# Patient Record
Sex: Female | Born: 1960 | Race: Black or African American | Hispanic: No | Marital: Single | Smoking: Never smoker
Health system: Southern US, Community
[De-identification: ages and names within clinical notes are randomized; demographics above are authoritative.]

## PROBLEM LIST (undated history)

## (undated) DIAGNOSIS — D649 Anemia, unspecified: Secondary | ICD-10-CM

## (undated) DIAGNOSIS — I10 Essential (primary) hypertension: Secondary | ICD-10-CM

## (undated) DIAGNOSIS — J40 Bronchitis, not specified as acute or chronic: Secondary | ICD-10-CM

## (undated) DIAGNOSIS — R42 Dizziness and giddiness: Secondary | ICD-10-CM

## (undated) DIAGNOSIS — E109 Type 1 diabetes mellitus without complications: Secondary | ICD-10-CM

## (undated) DIAGNOSIS — T7840XA Allergy, unspecified, initial encounter: Secondary | ICD-10-CM

## (undated) HISTORY — DX: Essential (primary) hypertension: I10

## (undated) HISTORY — DX: Anemia, unspecified: D64.9

## (undated) HISTORY — DX: Bronchitis, not specified as acute or chronic: J40

## (undated) HISTORY — DX: Allergy, unspecified, initial encounter: T78.40XA

## (undated) HISTORY — DX: Type 1 diabetes mellitus without complications: E10.9

## (undated) HISTORY — PX: OTHER SURGICAL HISTORY: SHX169

---

## 1999-02-21 ENCOUNTER — Other Ambulatory Visit: Admission: RE | Admit: 1999-02-21 | Discharge: 1999-02-21 | Payer: Self-pay | Admitting: Endocrinology

## 1999-08-14 ENCOUNTER — Encounter: Admission: RE | Admit: 1999-08-14 | Discharge: 1999-11-12 | Payer: Self-pay | Admitting: Endocrinology

## 2000-12-23 ENCOUNTER — Other Ambulatory Visit: Admission: RE | Admit: 2000-12-23 | Discharge: 2000-12-23 | Payer: Self-pay | Admitting: Obstetrics and Gynecology

## 2006-11-05 ENCOUNTER — Ambulatory Visit: Payer: Self-pay | Admitting: Endocrinology

## 2006-11-05 LAB — CONVERTED CEMR LAB
ALT: 23 units/L (ref 0–40)
AST: 22 units/L (ref 0–37)
Basophils Relative: 0.6 % (ref 0.0–1.0)
Bilirubin, Direct: 0.1 mg/dL (ref 0.0–0.3)
CO2: 27 meq/L (ref 19–32)
Calcium: 9.1 mg/dL (ref 8.4–10.5)
Chloride: 105 meq/L (ref 96–112)
Crystals: NEGATIVE
Eosinophils Relative: 4.1 % (ref 0.0–5.0)
Glucose, Bld: 112 mg/dL — ABNORMAL HIGH (ref 70–99)
Lymphocytes Relative: 35.3 % (ref 12.0–46.0)
Mucus, UA: NEGATIVE
Nitrite: NEGATIVE
Platelets: 255 10*3/uL (ref 150–400)
RBC: 4.78 M/uL (ref 3.87–5.11)
Specific Gravity, Urine: 1.02 (ref 1.000–1.03)
Total CHOL/HDL Ratio: 3.3
Total Protein, Urine: NEGATIVE mg/dL
Total Protein: 7.8 g/dL (ref 6.0–8.3)
Urine Glucose: NEGATIVE mg/dL
WBC: 8.5 10*3/uL (ref 4.5–10.5)

## 2006-11-07 ENCOUNTER — Ambulatory Visit: Payer: Self-pay | Admitting: Endocrinology

## 2006-11-07 LAB — CONVERTED CEMR LAB
Basophils Absolute: 0 10*3/uL (ref 0.0–0.1)
Eosinophils Absolute: 0.3 10*3/uL (ref 0.0–0.6)
Hemoglobin: 10.2 g/dL — ABNORMAL LOW (ref 12.0–15.0)
Hgb A1c MFr Bld: 6.8 % — ABNORMAL HIGH (ref 4.6–6.0)
Iron: 39 ug/dL — ABNORMAL LOW (ref 42–145)
MCHC: 32.2 g/dL (ref 30.0–36.0)
Monocytes Absolute: 0.5 10*3/uL (ref 0.2–0.7)
Monocytes Relative: 6.2 % (ref 3.0–11.0)
RBC: 4.23 M/uL (ref 3.87–5.11)
RDW: 14.5 % (ref 11.5–14.6)

## 2006-12-11 ENCOUNTER — Ambulatory Visit: Payer: Self-pay | Admitting: Endocrinology

## 2007-01-14 ENCOUNTER — Encounter: Admission: RE | Admit: 2007-01-14 | Discharge: 2007-01-14 | Payer: Self-pay | Admitting: Endocrinology

## 2007-01-17 ENCOUNTER — Encounter: Admission: RE | Admit: 2007-01-17 | Discharge: 2007-01-17 | Payer: Self-pay | Admitting: Endocrinology

## 2007-03-20 ENCOUNTER — Encounter: Payer: Self-pay | Admitting: *Deleted

## 2007-03-20 DIAGNOSIS — I10 Essential (primary) hypertension: Secondary | ICD-10-CM | POA: Insufficient documentation

## 2007-03-20 DIAGNOSIS — E118 Type 2 diabetes mellitus with unspecified complications: Secondary | ICD-10-CM | POA: Insufficient documentation

## 2007-03-20 DIAGNOSIS — M2550 Pain in unspecified joint: Secondary | ICD-10-CM | POA: Insufficient documentation

## 2007-03-20 DIAGNOSIS — L259 Unspecified contact dermatitis, unspecified cause: Secondary | ICD-10-CM | POA: Insufficient documentation

## 2007-03-20 DIAGNOSIS — E119 Type 2 diabetes mellitus without complications: Secondary | ICD-10-CM

## 2007-03-20 DIAGNOSIS — J309 Allergic rhinitis, unspecified: Secondary | ICD-10-CM | POA: Insufficient documentation

## 2007-09-10 ENCOUNTER — Encounter: Admission: RE | Admit: 2007-09-10 | Discharge: 2007-09-10 | Payer: Self-pay | Admitting: Endocrinology

## 2010-02-24 ENCOUNTER — Ambulatory Visit: Payer: Self-pay | Admitting: Endocrinology

## 2010-02-24 LAB — CONVERTED CEMR LAB
BUN: 7 mg/dL (ref 6–23)
Basophils Absolute: 0 10*3/uL (ref 0.0–0.1)
Bilirubin, Direct: 0.1 mg/dL (ref 0.0–0.3)
Chloride: 103 meq/L (ref 96–112)
Cholesterol: 127 mg/dL (ref 0–200)
Creatinine, Ser: 0.6 mg/dL (ref 0.4–1.2)
Eosinophils Absolute: 0.4 10*3/uL (ref 0.0–0.7)
Eosinophils Relative: 5.2 % — ABNORMAL HIGH (ref 0.0–5.0)
Glucose, Bld: 120 mg/dL — ABNORMAL HIGH (ref 70–99)
LDL Cholesterol: 70 mg/dL (ref 0–99)
Lymphs Abs: 2.8 10*3/uL (ref 0.7–4.0)
MCHC: 31.6 g/dL (ref 30.0–36.0)
MCV: 73.8 fL — ABNORMAL LOW (ref 78.0–100.0)
Monocytes Absolute: 0.5 10*3/uL (ref 0.1–1.0)
Neutrophils Relative %: 52.7 % (ref 43.0–77.0)
Platelets: 235 10*3/uL (ref 150.0–400.0)
RDW: 18.1 % — ABNORMAL HIGH (ref 11.5–14.6)
TSH: 0.99 microintl units/mL (ref 0.35–5.50)
Total Bilirubin: 0.5 mg/dL (ref 0.3–1.2)
Total Protein, Urine: NEGATIVE mg/dL
Triglycerides: 49 mg/dL (ref 0.0–149.0)
Urine Glucose: NEGATIVE mg/dL
Urobilinogen, UA: 0.2 (ref 0.0–1.0)
VLDL: 9.8 mg/dL (ref 0.0–40.0)
WBC: 8.1 10*3/uL (ref 4.5–10.5)

## 2010-02-28 ENCOUNTER — Ambulatory Visit: Payer: Self-pay | Admitting: Endocrinology

## 2010-02-28 ENCOUNTER — Encounter: Admission: RE | Admit: 2010-02-28 | Discharge: 2010-02-28 | Payer: Self-pay | Admitting: Endocrinology

## 2010-02-28 ENCOUNTER — Encounter: Payer: Self-pay | Admitting: Endocrinology

## 2010-02-28 DIAGNOSIS — M79609 Pain in unspecified limb: Secondary | ICD-10-CM | POA: Insufficient documentation

## 2010-02-28 DIAGNOSIS — D649 Anemia, unspecified: Secondary | ICD-10-CM | POA: Insufficient documentation

## 2010-07-23 LAB — CONVERTED CEMR LAB
Saturation Ratios: 5 % — ABNORMAL LOW (ref 20.0–50.0)
Transferrin: 416.8 mg/dL — ABNORMAL HIGH (ref 212.0–360.0)

## 2010-07-27 NOTE — Assessment & Plan Note (Signed)
Summary: PHYSICAL--STC   Vital Signs:  Patient profile:   50 year old female Height:      63 inches (160.02 cm) Weight:      253.50 pounds (115.23 kg) BMI:     45.07 O2 Sat:      99 % on Room air Temp:     97.6 degrees F (36.44 degrees C) oral Pulse rate:   78 / minute BP sitting:   132 / 84  (right arm) Cuff size:   large  Vitals Entered By: Brenton Grills MA (February 28, 2010 1:27 PM)  O2 Flow:  Room air CC: Physical/pt is not taking Cardizem, Allegra, or Glucophage/aj Is Patient Diabetic? Yes Comments pt is due for tetanus, colonoscopy, and Pap   CC:  Physical/pt is not taking Cardizem, Allegra, and or Glucophage/aj.  History of Present Illness: here for regular wellness examination.  He's feeling pretty well in general, and does not smoke.  alcohol is rare.   Current Medications (verified): 1)  Cardizem Cd 120 Mg  Cp24 (Diltiazem Hcl Coated Beads) .... Take 1 By Mouth Qd 2)  Allegra 180 Mg  Tabs (Fexofenadine Hcl) .... Take 1 By Mouth Once Daily Prn 3)  Glucophage 500 Mg  Tabs (Metformin Hcl) .... Take 1 By Mouth Qd 4)  Motrin Ib 200 Mg Tabs (Ibuprofen) .... 3 Tablet By Mouth Two Times A Day  Allergies (verified): No Known Drug Allergies  Family History: Reviewed history and no changes required. mother had uncertain type of cancer--deceased  Social History: Reviewed history and no changes required. 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  denies dub  Physical Exam  General:  morbidly obese.   Head:  head: no deformity eyes: no periorbital swelling, no proptosis external nose and ears are normal mouth: no lesion seen Neck:  Supple without thyroid enlargement or tenderness.  Breasts:  sees gyn  Lungs:  clear to auscultation.  no respiratory distress  Heart:  Regular rate and rhythm without murmurs or gallops noted. Normal S1,S2.   Abdomen:   abdomen is soft, nontender.  no hepatosplenomegaly.   not distended.  no hernia  Rectal:  sees gyn  Genitalia:  sees gyn  Msk:  muscle bulk and strength are grossly normal.  no obvious joint swelling.  gait is normal and steady  Extremities:  no deformity.  no ulcer on the feet.  feet are of normal color and temp.  no edema  Neurologic:  cn 2-12 grossly intact.   readily moves all 4's.   sensation is intact to touch on the feet  Cervical Nodes:  No significant adenopathy.  Psych:  Alert and cooperative; normal mood and affect; normal attention span and concentration.   Additional Exam:  SEPARATE EVALUATION FOLLOWS--EACH PROBLEM HERE IS NEW, NOT RESPONDING TO TREATMENT, OR POSES SIGNIFICANT RISK TO THE PATIENT'S HEALTH: HISTORY OF THE PRESENT ILLNESS: pt tripped and fell 5d ago.  since then she has 5 days of slight pain at the left hand, worst at the middle finger, and assoc swelling pt has anemia x few years.  she has not recently taken fe tabs.   constipation:  pt says due to fe tabs. dm:  she does not take metformin.   PAST MEDICAL HISTORY reviewed and up to date today REVIEW OF SYSTEMS: denies brbpr and hematuria PHYSICAL EXAMINATION: pulses:  dorsalis pedis intact bilat.  no carotid bruit skin:  abrasions are healing at the left upper arm left hand:  slight diffuse swelling, but more so at the middle finger pip joint LAB/XRAY RESULTS: Iron Saturation      [L]  5.0 %         20.0-50.0 Hemoglobin A1C       [H]  7.9 %   x-ray is neg IMPRESSION: fe-deficiency anemia, needs increased rx hand contusion dm, needs increased rx constipation, due to fe tabs PLAN: see instruction page   Impression & Recommendations:  Problem # 1:  ROUTINE GENERAL MEDICAL EXAM@HEALTH  CARE FACL (ICD-V70.0)  Medications Added to Medication List This Visit: 1)  Glucophage 500 Mg Tabs (Metformin hcl) .... Take 1 by mouth once daily 2)  Motrin Ib 200 Mg Tabs (Ibuprofen) .... 3 tablet by mouth two  times a day  Other Orders: T-Hand Left 3 Views (73130TC) Gastroenterology Referral (GI) TLB-IBC Pnl (Iron/FE;Transferrin) (83550-IBC) TLB-B12, Serum-Total ONLY (82607-B12) TLB-A1C / Hgb A1C (Glycohemoglobin) (83036-A1C) Est. Patient Level IV (95621) Est. Patient 40-64 years (30865)  Preventive Care Screening     pneumovax is refused 2011   Patient Instructions: 1)  blood tests are being ordered for you today.  please call [phone] to hear your test results. 2)  you can stay-off the diltiazem for now. 3)  please consider these measures for your health:  minimize alcohol.  do not use tobacco products.  have a colonoscopy at least every 10 years from age 16.  keep firearms safely stored.  always use seat belts.  have working smoke alarms in your home.  see an eye doctor and dentist regularly.  never drive under the influence of alcohol or drugs (including prescription drugs).   4)  Please schedule a follow-up appointment in 3 months. 5)  refer for colonoscopy.  you will be called with a day and time for an appointment 6)  (update: i left message on phone-tree:  go for colonoscopy.  take miralax.  take fe 1 tab two times a day.  resume metformin.  please call with pharmacy name).   Preventive Care Screening     pneumovax is refused 2011

## 2012-07-16 ENCOUNTER — Emergency Department (INDEPENDENT_AMBULATORY_CARE_PROVIDER_SITE_OTHER)
Admission: EM | Admit: 2012-07-16 | Discharge: 2012-07-16 | Disposition: A | Discharge status: Home / Self Care | Payer: Self-pay | Source: Home / Self Care

## 2012-07-16 DIAGNOSIS — I10 Essential (primary) hypertension: Secondary | ICD-10-CM

## 2012-07-16 LAB — POCT I-STAT, CHEM 8
BUN: 10 mg/dL (ref 6–23)
Creatinine, Ser: 0.6 mg/dL (ref 0.50–1.10)
Glucose, Bld: 253 mg/dL — ABNORMAL HIGH (ref 70–99)
Hemoglobin: 16.7 g/dL — ABNORMAL HIGH (ref 12.0–15.0)
Potassium: 4 mEq/L (ref 3.5–5.1)
Sodium: 137 mEq/L (ref 135–145)

## 2012-07-16 MED ORDER — HYDROCHLOROTHIAZIDE 12.5 MG PO TABS: 12.5000 mg | ORAL_TABLET | Freq: Every day | ORAL | Status: DC

## 2012-07-16 NOTE — ED Provider Notes (Signed)
History     CSN: [id number]  Arrival date & time 07/16/12  1549   None     Chief Complaint  Patient presents with  . Hypertension    (Consider location/radiation/quality/duration/timing/severity/associated sxs/prior treatment) HPI  Patient presents for evaluation of elevated BP measurement at a pre-employment visit. Onset was today with unchanged course since that time. On ROS patient denies blurred vision, chest pain, dyspnea, headache, neck aches, orthopnea, palpitations, paroxysmal nocturnal dyspnea, peripheral edema, pulsating in the ears and tiredness/fatigue. States she feels perfectly normal but just had the high reading in the office.  Cardiovascular risk factors are hypertension and obesity (BMI >= 30 kg/m2). Agents associated with hypertension include none. Previously on diltiazem only.   Past medical history-hypertension but lost 70 lbs 2 years ago and no longer on meds. Has not seen PCP since that time. Patient also told at risk for DM based off a1c.   Family history is positive for hypertension and diabetes mellitus in both parents.  Social history-no smoking or illicit drugs. Occasional glass of wine.   OB History    No data available      Review of Systems  Allergies  Review of patient's allergies indicates no known allergies.  Home Medications  No current outpatient prescriptions on file.  BP 205/110  Pulse 85  Temp 98.1 F (36.7 C) (Oral)  Resp 18  Ht 4\' 11"  (1.499 m)  Wt 200 lb (90.719 kg)  BMI 40.39 kg/m2  SpO2 100%  LMP 07/16/2012 Repeat with large cuff noted at 166/85 on right and 194/105 on left.   Physical Exam  Constitutional: She is oriented to person, place, and time. She appears well-developed and well-nourished.  HENT:  Head: Normocephalic and atraumatic.  Eyes: Conjunctivae normal and EOM are normal. Pupils are equal, round, and reactive to light.       Fundi benign on portions visualized.   Neck: Normal range of motion. Neck  supple.  Cardiovascular: Normal rate and regular rhythm.  Exam reveals no gallop and no friction rub.   Murmur (2/6 SEM (reports has had this since birth)) heard. Pulmonary/Chest: Effort normal and breath sounds normal.  Abdominal: Soft. Bowel sounds are normal.  Musculoskeletal: Normal range of motion. She exhibits edema (trace).  Neurological: She is alert and oriented to person, place, and time.  Skin: Skin is warm and dry.    ED Course  Procedures   No diagnosis found.  Studies: Lab: i-stat chem 8 shows elevated CBG and normal creatinine with no electrolyte abnormalities Initial Electrocardiogram: NSR and t wave inversionsin v1-v3, unknown how old these are. Some flattening t waves in III, avf. Otherwise normal. Reviewed by Dr. Tressia Danas who adds the following: no ischemic changes.   Records Reviewed: Previous electrocardiograms not available.   Treatments: None but planned HCTZ on outpatient basis  Disposition: Home stable. Patient to follow up with PCP in 1-2 days.   MDM  Elevated blood pressure with history of hypertension. Will start HCTZ with close PCP follow up. Patient noted to have elevated CBG and will need to follow up with PCP for DM testing.  No signs of end organ damage by history, i-stat with creatinine, or EKG.   Shelva Majestic, MD 07/16/12 1818  Shelva Majestic, MD 07/16/12 [phone]

## 2012-07-16 NOTE — ED Notes (Signed)
Pt reports that she went for occ. Health pre employment physical and that her bloopd pressure was extremely high. Denies any signs or symptoms of htn . Does have hx of htn - recent weight loss caused primary md to stop htn meds

## 2012-07-17 NOTE — ED Provider Notes (Signed)
Medical screening examination/treatment/procedure(s) were performed by PGY-2 FM resident and as supervising physician I was immediately available for consultation/collaboration.   Sharin Grave, MD   Sharin Grave, MD 07/17/12 1011

## 2012-08-01 ENCOUNTER — Ambulatory Visit (INDEPENDENT_AMBULATORY_CARE_PROVIDER_SITE_OTHER): Payer: Managed Care, Other (non HMO) | Admitting: Endocrinology

## 2012-08-01 VITALS — BP 144/78 | HR 100 | Wt 207.0 lb

## 2012-08-01 DIAGNOSIS — I10 Essential (primary) hypertension: Secondary | ICD-10-CM

## 2012-08-01 DIAGNOSIS — E119 Type 2 diabetes mellitus without complications: Secondary | ICD-10-CM

## 2012-08-01 DIAGNOSIS — Z79899 Other long term (current) drug therapy: Secondary | ICD-10-CM

## 2012-08-01 DIAGNOSIS — D649 Anemia, unspecified: Secondary | ICD-10-CM

## 2012-08-01 DIAGNOSIS — H510 Palsy (spasm) of conjugate gaze: Secondary | ICD-10-CM

## 2012-08-01 LAB — BASIC METABOLIC PANEL
CO2: 27 mEq/L (ref 19–32)
Chloride: 94 mEq/L — ABNORMAL LOW (ref 96–112)
Glucose, Bld: 393 mg/dL — ABNORMAL HIGH (ref 70–99)
Sodium: 131 mEq/L — ABNORMAL LOW (ref 135–145)

## 2012-08-01 LAB — CBC WITH DIFFERENTIAL/PLATELET
Basophils Absolute: 0 10*3/uL (ref 0.0–0.1)
Basophils Relative: 0.2 % (ref 0.0–3.0)
Eosinophils Relative: 2.1 % (ref 0.0–5.0)
HCT: 42.7 % (ref 36.0–46.0)
Hemoglobin: 14.1 g/dL (ref 12.0–15.0)
Lymphocytes Relative: 32.1 % (ref 12.0–46.0)
Lymphs Abs: 2.4 10*3/uL (ref 0.7–4.0)
Monocytes Relative: 4.4 % (ref 3.0–12.0)
Neutro Abs: 4.6 10*3/uL (ref 1.4–7.7)
RBC: 5.39 Mil/uL — ABNORMAL HIGH (ref 3.87–5.11)
WBC: 7.5 10*3/uL (ref 4.5–10.5)

## 2012-08-01 LAB — URINALYSIS, ROUTINE W REFLEX MICROSCOPIC
Bilirubin Urine: NEGATIVE
Hgb urine dipstick: NEGATIVE
Ketones, ur: NEGATIVE
Leukocytes, UA: NEGATIVE
Urobilinogen, UA: 0.2 (ref 0.0–1.0)

## 2012-08-01 LAB — HEPATIC FUNCTION PANEL
ALT: 31 U/L (ref 0–35)
Total Bilirubin: 0.4 mg/dL (ref 0.3–1.2)

## 2012-08-01 LAB — IBC PANEL: Saturation Ratios: 17.1 % — ABNORMAL LOW (ref 20.0–50.0)

## 2012-08-01 LAB — LIPID PANEL
HDL: 57.2 mg/dL (ref >39.00)
Total CHOL/HDL Ratio: 3
VLDL: 16.2 mg/dL (ref 0.0–40.0)

## 2012-08-01 LAB — MICROALBUMIN / CREATININE URINE RATIO
Creatinine,U: 29.1 mg/dL
Microalb Creat Ratio: 6.5 mg/g (ref 0.0–30.0)

## 2012-08-01 LAB — HEMOGLOBIN A1C: Hgb A1c MFr Bld: 15.5 % — ABNORMAL HIGH (ref 4.6–6.5)

## 2012-08-01 MED ORDER — METFORMIN HCL ER 500 MG PO TB24: 500.0000 mg | ORAL_TABLET | Freq: Two times a day (BID) | ORAL | Status: DC

## 2012-08-01 MED ORDER — LOSARTAN POTASSIUM-HCTZ 50-12.5 MG PO TABS: 1.0000 | ORAL_TABLET | Freq: Every day | ORAL | Status: DC

## 2012-08-01 NOTE — Progress Notes (Signed)
  Subjective:    Patient ID: Kaylee Lynn, female    DOB: 1960-11-28, 52 y.o.   MRN: [id number]  HPI Pt was seen in ER 2 weeks ago with severe HTN.  She was rx'ed hctz.  Denies chest pain. She was also noted to have hyperglycemia.  She has not recently taken metformin.  She has lost weight. She was also noted to have polycythemia.  Denies sob. No past medical history on file.  No past surgical history on file.  History   Social History  . Marital Status: Single    Spouse Name: N/A    Number of Children: N/A  . Years of Education: N/A   Occupational History  . Not on file.   Social History Main Topics  . Smoking status: Not on file  . Smokeless tobacco: Not on file  . Alcohol Use: Not on file  . Drug Use: Not on file  . Sexually Active: Not on file   Other Topics Concern  . Not on file   Social History Narrative  . No narrative on file    Current Outpatient Prescriptions on File Prior to Visit  Medication Sig Dispense Refill  . losartan-hydrochlorothiazide (HYZAAR) 50-12.5 MG per tablet Take 1 tablet by mouth daily.  30 tablet  11  . metFORMIN (GLUCOPHAGE-XR) 500 MG 24 hr tablet Take 1 tablet (500 mg total) by mouth 2 (two) times daily.  60 tablet  11    No Known Allergies  No family history on file.  BP 144/78  Pulse 100  Wt 207 lb (93.895 kg)  SpO2 96%  LMP 07/16/2012   Review of Systems Denies edema, but she has slight acral numbness.      Objective:   Physical Exam VITAL SIGNS:  See vs page GENERAL: no distress Ext: no edema.      Assessment & Plan:  HTN, improved Polycythemia, uncertain etiology DM, therapy limited by noncompliance.  i'll do the best i can.

## 2012-08-01 NOTE — Patient Instructions (Addendum)
Change your blood pressure pill to losartan-hctz.   Also, please resume the metformin.  i have sent 2 prescriptions to your pharmacy. Please come back for a regular physical appointment in 2 weeks. blood tests are being requested for you today.  We'll contact you with results. Refer to an eye specialist.  you will receive a phone call, about a day and time for an appointment

## 2012-09-01 ENCOUNTER — Telehealth: Payer: Self-pay | Admitting: Endocrinology

## 2012-09-01 NOTE — Telephone Encounter (Signed)
Wants results from most recent labs, also wants to know when next appt is needed. CB# [phone] / Sherri S.

## 2012-09-02 NOTE — Telephone Encounter (Signed)
Left message pt is past due for 2 week follow-up ov

## 2012-09-09 ENCOUNTER — Ambulatory Visit: Payer: Managed Care, Other (non HMO) | Admitting: Endocrinology

## 2013-09-22 ENCOUNTER — Ambulatory Visit (INDEPENDENT_AMBULATORY_CARE_PROVIDER_SITE_OTHER): Payer: Managed Care, Other (non HMO) | Admitting: Endocrinology

## 2013-09-22 VITALS — BP 130/98 | HR 88 | Temp 98.6°F | Ht 59.0 in | Wt 197.0 lb

## 2013-09-22 DIAGNOSIS — Z23 Encounter for immunization: Secondary | ICD-10-CM

## 2013-09-22 DIAGNOSIS — E119 Type 2 diabetes mellitus without complications: Secondary | ICD-10-CM

## 2013-09-22 DIAGNOSIS — D649 Anemia, unspecified: Secondary | ICD-10-CM

## 2013-09-22 DIAGNOSIS — I10 Essential (primary) hypertension: Secondary | ICD-10-CM

## 2013-09-22 DIAGNOSIS — Z129 Encounter for screening for malignant neoplasm, site unspecified: Secondary | ICD-10-CM | POA: Insufficient documentation

## 2013-09-22 DIAGNOSIS — Z79899 Other long term (current) drug therapy: Secondary | ICD-10-CM

## 2013-09-22 LAB — MICROALBUMIN / CREATININE URINE RATIO
CREATININE, U: 45.5 mg/dL
Microalb Creat Ratio: 4.2 mg/g (ref 0.0–30.0)
Microalb, Ur: 1.9 mg/dL (ref 0.0–1.9)

## 2013-09-22 LAB — HEPATIC FUNCTION PANEL
ALBUMIN: 3.7 g/dL (ref 3.5–5.2)
ALT: 29 U/L (ref 0–35)
AST: 31 U/L (ref 0–37)
Alkaline Phosphatase: 70 U/L (ref 39–117)
Bilirubin, Direct: 0.1 mg/dL (ref 0.0–0.3)
TOTAL PROTEIN: 7.4 g/dL (ref 6.0–8.3)
Total Bilirubin: 0.4 mg/dL (ref 0.3–1.2)

## 2013-09-22 LAB — URINALYSIS, ROUTINE W REFLEX MICROSCOPIC
Bilirubin Urine: NEGATIVE
HGB URINE DIPSTICK: NEGATIVE
KETONES UR: NEGATIVE
Leukocytes, UA: NEGATIVE
Nitrite: NEGATIVE
Specific Gravity, Urine: 1.015 (ref 1.000–1.030)
Total Protein, Urine: NEGATIVE
UROBILINOGEN UA: 0.2 (ref 0.0–1.0)
Urine Glucose: >=1000 — AB
pH: 6 (ref 5.0–8.0)

## 2013-09-22 LAB — CBC WITH DIFFERENTIAL/PLATELET
BASOS ABS: 0 10*3/uL (ref 0.0–0.1)
Basophils Relative: 0.2 % (ref 0.0–3.0)
Eosinophils Absolute: 0.2 10*3/uL (ref 0.0–0.7)
Eosinophils Relative: 2.2 % (ref 0.0–5.0)
HEMATOCRIT: 42.3 % (ref 36.0–46.0)
Hemoglobin: 13.4 g/dL (ref 12.0–15.0)
LYMPHS ABS: 2.7 10*3/uL (ref 0.7–4.0)
Lymphocytes Relative: 37.6 % (ref 12.0–46.0)
MCHC: 31.8 g/dL (ref 30.0–36.0)
MCV: 80.7 fl (ref 78.0–100.0)
MONO ABS: 0.4 10*3/uL (ref 0.1–1.0)
Monocytes Relative: 5.7 % (ref 3.0–12.0)
Neutro Abs: 3.8 10*3/uL (ref 1.4–7.7)
Neutrophils Relative %: 54.3 % (ref 43.0–77.0)
PLATELETS: 179 10*3/uL (ref 150.0–400.0)
RBC: 5.25 Mil/uL — ABNORMAL HIGH (ref 3.87–5.11)
RDW: 13.4 % (ref 11.5–14.6)
WBC: 7.1 10*3/uL (ref 4.5–10.5)

## 2013-09-22 LAB — IBC PANEL
Iron: 71 ug/dL (ref 42–145)
Saturation Ratios: 19.8 % — ABNORMAL LOW (ref 20.0–50.0)
Transferrin: 256.6 mg/dL (ref 212.0–360.0)

## 2013-09-22 LAB — BASIC METABOLIC PANEL
BUN: 16 mg/dL (ref 6–23)
CHLORIDE: 103 meq/L (ref 96–112)
CO2: 23 meq/L (ref 19–32)
CREATININE: 0.5 mg/dL (ref 0.4–1.2)
Calcium: 9 mg/dL (ref 8.4–10.5)
GFR: 153.99 mL/min (ref >60.00)
Glucose, Bld: 304 mg/dL — ABNORMAL HIGH (ref 70–99)
Potassium: 4 mEq/L (ref 3.5–5.1)
Sodium: 135 mEq/L (ref 135–145)

## 2013-09-22 LAB — HEMOGLOBIN A1C

## 2013-09-22 LAB — LIPID PANEL
CHOL/HDL RATIO: 3
Cholesterol: 171 mg/dL (ref 0–200)
HDL: 59.3 mg/dL (ref >39.00)
LDL CALC: 96 mg/dL (ref 0–99)
Triglycerides: 79 mg/dL (ref 0.0–149.0)
VLDL: 15.8 mg/dL (ref 0.0–40.0)

## 2013-09-22 LAB — TSH: TSH: 0.55 u[IU]/mL (ref 0.35–5.50)

## 2013-09-22 MED ORDER — TRIAMCINOLONE ACETONIDE 0.1 % EX CREA: 1.0000 "application " | TOPICAL_CREAM | Freq: Three times a day (TID) | CUTANEOUS | Status: DC

## 2013-09-22 MED ORDER — LOSARTAN POTASSIUM-HCTZ 100-25 MG PO TABS: 1.0000 | ORAL_TABLET | Freq: Every day | ORAL | Status: DC

## 2013-09-22 NOTE — Progress Notes (Signed)
Subjective:    Patient ID: Kaylee Lynn, female    DOB: 1960-09-26, 53 y.o.   MRN: [id number]  HPI Pt is here for regular wellness examination, and is feeling pretty well in general, and says chronic med probs are stable, except as noted below No past medical history on file.  No past surgical history on file.  History   Social History  . Marital Status: Single    Spouse Name: N/A    Number of Children: N/A  . Years of Education: N/A   Occupational History  . Not on file.   Social History Main Topics  . Smoking status: Not on file  . Smokeless tobacco: Not on file  . Alcohol Use: Not on file  . Drug Use: Not on file  . Sexual Activity: Not on file   Other Topics Concern  . Not on file   Social History Narrative  . No narrative on file    Current Outpatient Prescriptions on File Prior to Visit  Medication Sig Dispense Refill  . metFORMIN (GLUCOPHAGE-XR) 500 MG 24 hr tablet Take 1 tablet (500 mg total) by mouth 2 (two) times daily.  60 tablet  11   No current facility-administered medications on file prior to visit.    No Known Allergies  No family history on file.  BP 130/98  Pulse 88  Temp(Src) 98.6 F (37 C) (Oral)  Ht 4\' 11"  (1.499 m)  Wt 197 lb (89.359 kg)  BMI 39.77 kg/m2  SpO2 98%  Review of Systems  Constitutional: Negative for fever.  HENT: Negative for hearing loss.   Eyes: Negative for itching.  Cardiovascular: Negative for chest pain.  Gastrointestinal: Negative for blood in stool.  Endocrine: Negative for cold intolerance.  Genitourinary: Negative for hematuria.  Musculoskeletal: Negative for back pain.  Skin: Negative for wound.  Allergic/Immunologic: Positive for environmental allergies.  Neurological: Negative for syncope and numbness.  Hematological: Bruises/bleeds easily.  Psychiatric/Behavioral: Negative for dysphoric mood.       Objective:   Physical Exam VS: see vs page GEN: no distress HEAD: head: no  deformity eyes: no periorbital swelling, no proptosis external nose and ears are normal mouth: no lesion seen NECK: supple, thyroid is not enlarged CHEST WALL: no deformity LUNGS:  Clear to auscultation BREASTS: sees gyn CV: reg rate and rhythm, no murmur ABD: abdomen is soft, nontender.  no hepatosplenomegaly.  not distended.  no hernia GENITALIA/RECTAL: sees gyn MUSCULOSKELETAL: muscle bulk and strength are grossly normal.  no obvious joint swelling.  gait is normal and steady PULSES: no carotid bruit NEURO:  cn 2-12 grossly intact.   readily moves all 4's.   SKIN:  Normal texture and temperature.  No rash or suspicious lesion is visible.   NODES:  None palpable at the neck PSYCH: alert, well-oriented.  Does not appear anxious nor depressed.      Assessment & Plan:  Wellness visit today, with problems stable, except as noted.    SEPARATE EVALUATION FOLLOWS--EACH PROBLEM HERE IS NEW, NOT RESPONDING TO TREATMENT, OR POSES SIGNIFICANT RISK TO THE PATIENT'S HEALTH: HISTORY OF THE PRESENT ILLNESS: Pt states many years of slight rash on the dorsal aspect of the hands, and assoc itching.  HC cream doesn't help.   PAST MEDICAL HISTORY reviewed and up to date today REVIEW OF SYSTEMS: She has lost a few lbs.  Denies sob. PHYSICAL EXAMINATION: VITAL SIGNS:  See vs page GENERAL: no distress LAB/XRAY RESULTS: Lab Results  Component Value Date  HGBA1C 16.0 Repeated and verified X2.* 09/22/2013  IMPRESSION: Eczema, worse DM: with severe hyperglycemia PLAN: See instruction page Pt is called in to start insulin asap.

## 2013-09-22 NOTE — Patient Instructions (Addendum)
please consider these measures for your health:  minimize alcohol.  do not use tobacco products.  have a colonoscopy at least every 10 years from age 55.  Women should have an annual mammogram from age 37.  keep firearms safely stored.  always use seat belts.  have working smoke alarms in your home.  see an eye doctor and dentist regularly.  never drive under the influence of alcohol or drugs (including prescription drugs).  i have sent a prescription to your pharmacy, for a skin cream.   Please increase the blood-pressure pill.  i have sent a prescription to your pharmacy.   blood tests are being requested for you today.  We'll contact you with results.  Please come back for a follow-up appointment in 6 months.

## 2013-09-23 ENCOUNTER — Ambulatory Visit (INDEPENDENT_AMBULATORY_CARE_PROVIDER_SITE_OTHER): Payer: Managed Care, Other (non HMO) | Admitting: Endocrinology

## 2013-09-23 ENCOUNTER — Encounter: Payer: Self-pay | Admitting: Endocrinology

## 2013-09-23 ENCOUNTER — Encounter: Payer: Managed Care, Other (non HMO) | Attending: Endocrinology | Admitting: Nutrition

## 2013-09-23 VITALS — BP 130/96 | HR 78 | Temp 98.3°F | Ht 59.0 in | Wt 195.0 lb

## 2013-09-23 DIAGNOSIS — E119 Type 2 diabetes mellitus without complications: Secondary | ICD-10-CM

## 2013-09-23 DIAGNOSIS — Z713 Dietary counseling and surveillance: Secondary | ICD-10-CM | POA: Insufficient documentation

## 2013-09-23 MED ORDER — ONETOUCH VERIO IQ SYSTEM W/DEVICE KIT: 1.0000 | PACK | Freq: Once | Status: DC

## 2013-09-23 MED ORDER — INSULIN DETEMIR 100 UNIT/ML FLEXPEN: 40.0000 [IU] | PEN_INJECTOR | SUBCUTANEOUS | Status: DC

## 2013-09-23 MED ORDER — GLUCOSE BLOOD VI STRP: 1.0000 | ORAL_STRIP | Freq: Two times a day (BID) | Status: DC

## 2013-09-23 NOTE — Patient Instructions (Addendum)
i have sent a prescription to your pharmacy, to start the insulin. 40 units a day is probably not enough. Call next week if it is still high, so we can increase it.   Please come back for a follow-up appointment in 2 weeks.   check your blood sugar twice a day.  vary the time of day when you check, between before the 3 meals, and at bedtime.  also check if you have symptoms of your blood sugar being too high or too low.  please keep a record of the readings and bring it to your next appointment here.  You can write it on any piece of paper.  please call us sooner if your blood sugar goes below 70, or if you have a lot of readings over 200.

## 2013-09-23 NOTE — Progress Notes (Signed)
This patient was instructed on the use of the Levemir pen.  She was given a starter kit and sample  Flextouch pen with BD pen starter kit..(exp. 4/16).  She drew up 40 units and injected without difficulty into her abdominal area. We discussed the timing of this insulin and the need to take it at the same time each day.   We also discussed low blood sugars--symptoms and treatment.  She was given a BD pen starter kit with glucose tablets to use for this.  She was also told to call if low blood sugars occur.  She agreed to do this.  She was advised to read over information given on the Levemir and diabetes and to call if questions.  We also discussed diet.  She is eating cereal ( she had some for a HS snack last night).   We discussed other snack options and other breakfast options that she can use.    She was given a One Touch Verio meter and shown how to use it.  Date/time set and she re demonstrated the technique well.  Her blood sugar was 386 acL today.  Discussed goals for blood sugar readings.  She was told to bring her meter to each appt.  She agreed to do this.

## 2013-09-23 NOTE — Progress Notes (Signed)
   Subjective:    Patient ID: Kaylee Lynn, female    DOB: September 19, 1960, 53 y.o.   MRN: [id number]  HPI Pt returns for f/u of type 2 DM (dx'ed 2008: she has mild if any neuropathy of the lower extremities; no known associated chronic complications; she has never been on insulin).   Pt reports polyuria No past medical history on file.  No past surgical history on file.  History   Social History  . Marital Status: Single    Spouse Name: N/A    Number of Children: N/A  . Years of Education: N/A   Occupational History  . Not on file.   Social History Main Topics  . Smoking status: Unknown If Ever Smoked  . Smokeless tobacco: Not on file  . Alcohol Use: Not on file  . Drug Use: Not on file  . Sexual Activity: Not on file   Other Topics Concern  . Not on file   Social History Narrative  . No narrative on file    Current Outpatient Prescriptions on File Prior to Visit  Medication Sig Dispense Refill  . losartan-hydrochlorothiazide (HYZAAR) 100-25 MG per tablet Take 1 tablet by mouth daily.  30 tablet  11  . metFORMIN (GLUCOPHAGE-XR) 500 MG 24 hr tablet Take 1 tablet (500 mg total) by mouth 2 (two) times daily.  60 tablet  11  . triamcinolone cream (KENALOG) 0.1 % Apply 1 application topically 3 (three) times daily. As needed for itching or rash  45 g  4   No current facility-administered medications on file prior to visit.    No Known Allergies  No family history on file.  BP 130/96  Pulse 78  Temp(Src) 98.3 F (36.8 C) (Oral)  Ht 4\' 11"  (1.499 m)  Wt 195 lb (88.451 kg)  BMI 39.36 kg/m2  SpO2 96%  Review of Systems Pt has polyuria also.  Denies n/v.   She has lost a few lbs      Objective:   Physical Exam VITAL SIGNS:  See vs page GENERAL: no distress   Lab Results  Component Value Date   HGBA1C 16.0 Repeated and verified X2.* 09/22/2013      Assessment & Plan:  DM: she needs insulin: after discussion, pt chooses qd insulin.   Weight loss: due to  severe hypoglycemia. HTN: as she just started increased rx, we'll continue the same for now.

## 2013-09-23 NOTE — Patient Instructions (Signed)
Stop eating cold cereal and milk, sweet drinks, and fruit juices.   Test blood sugars before breakfast, and at least one other time during the day--before meals or at bedtime. Take 40u of Levemir in the AM at the same time every day.

## 2013-10-08 ENCOUNTER — Ambulatory Visit (INDEPENDENT_AMBULATORY_CARE_PROVIDER_SITE_OTHER): Payer: Managed Care, Other (non HMO) | Admitting: Endocrinology

## 2013-10-08 ENCOUNTER — Encounter: Payer: Self-pay | Admitting: Endocrinology

## 2013-10-08 VITALS — BP 140/89 | HR 71 | Temp 98.1°F | Ht 59.0 in | Wt 197.0 lb

## 2013-10-08 DIAGNOSIS — E119 Type 2 diabetes mellitus without complications: Secondary | ICD-10-CM

## 2013-10-08 NOTE — Progress Notes (Signed)
   Subjective:    Patient ID: Kaylee Lynn, female    DOB: 13-Aug-1960, 53 y.o.   MRN: [id number]  HPI Pt returns for f/u of type 2 DM (dx'ed 2008: she has mild if any neuropathy of the lower extremities; no known associated chronic complications; pt started on insulin in march of 2015; she chose qd insulin; she has never had severe hypoglycemia, or DKA).  Pt reports polyuria.  no cbg record, but states cbg's vary from 70-200's.  It is in general higher in am than in the afternoon.   No past medical history on file.  No past surgical history on file.  History   Social History  . Marital Status: Single    Spouse Name: N/A    Number of Children: N/A  . Years of Education: N/A   Occupational History  . Not on file.   Social History Main Topics  . Smoking status: Unknown If Ever Smoked  . Smokeless tobacco: Not on file  . Alcohol Use: Not on file  . Drug Use: Not on file  . Sexual Activity: Not on file   Other Topics Concern  . Not on file   Social History Narrative  . No narrative on file    Current Outpatient Prescriptions on File Prior to Visit  Medication Sig Dispense Refill  . Blood Glucose Monitoring Suppl (ONETOUCH VERIO IQ SYSTEM) W/DEVICE KIT 1 Device by Does not apply route once.  1 kit  0  . glucose blood (ONETOUCH VERIO) test strip 1 each by Other route 2 (two) times daily. And lancets 2/day 250.01  100 each  12  . Insulin Detemir (LEVEMIR FLEXTOUCH) 100 UNIT/ML Pen Inject 40 Units into the skin every morning. And pen needles 1/day  15 mL  11  . losartan-hydrochlorothiazide (HYZAAR) 100-25 MG per tablet Take 1 tablet by mouth daily.  30 tablet  11  . triamcinolone cream (KENALOG) 0.1 % Apply 1 application topically 3 (three) times daily. As needed for itching or rash  45 g  4   No current facility-administered medications on file prior to visit.    No Known Allergies  No family history on file.  BP 140/89  Pulse 71  Temp(Src) 98.1 F (36.7 C) (Oral)   Ht _0  (1.499 m)  Wt 197 lb (89.359 kg)  BMI 39.77 kg/m2  SpO2 95%  Review of Systems She denies hypoglycemia.  She has gained a few lbs.      Objective:   Physical Exam VITAL SIGNS:  See vs page GENERAL: no distress SKIN:  Insulin injection sites at the anterior abdomen are normal.        Assessment & Plan:  DM: control is improved on levemir.  However, based on the pattern of her cbg's, she appears to need to slower-acting insulin, but she declines for now.   Weight gain: this complicates the rx of DM.

## 2013-10-08 NOTE — Patient Instructions (Addendum)
Please continue the same insulin for now.  Please come back for a follow-up appointment in 2 months.   Because of the pattern of your blood sugars, you may need to change to a slower insulin called "lantus."  check your blood sugar twice a day.  vary the time of day when you check, between before the 3 meals, and at bedtime.  also check if you have symptoms of your blood sugar being too high or too low.  please keep a record of the readings and bring it to your next appointment here.  You can write it on any piece of paper.  please call us sooner if your blood sugar goes below 70, or if you have a lot of readings over 200.

## 2013-10-22 ENCOUNTER — Encounter: Payer: Self-pay | Admitting: Endocrinology

## 2013-12-22 ENCOUNTER — Encounter: Payer: Self-pay | Admitting: Endocrinology

## 2013-12-22 ENCOUNTER — Ambulatory Visit (INDEPENDENT_AMBULATORY_CARE_PROVIDER_SITE_OTHER): Payer: Managed Care, Other (non HMO) | Admitting: Endocrinology

## 2013-12-22 VITALS — BP 132/80 | HR 82 | Temp 98.3°F | Ht 59.0 in | Wt 211.0 lb

## 2013-12-22 DIAGNOSIS — E119 Type 2 diabetes mellitus without complications: Secondary | ICD-10-CM

## 2013-12-22 MED ORDER — GLUCOSE BLOOD VI STRP: 1.0000 | ORAL_STRIP | Freq: Two times a day (BID) | Status: DC

## 2013-12-22 MED ORDER — INSULIN GLARGINE 100 UNIT/ML SOLOSTAR PEN: 40.0000 [IU] | PEN_INJECTOR | SUBCUTANEOUS | Status: DC

## 2013-12-22 NOTE — Patient Instructions (Addendum)
Please change levemir to "lantus," 40 units each morning.   check your blood sugar twice a day.  vary the time of day when you check, between before the 3 meals, and at bedtime.  also check if you have symptoms of your blood sugar being too high or too low.  please keep a record of the readings and bring it to your next appointment here.  You can write it on any piece of paper.  please call us sooner if your blood sugar goes below 70, or if you have a lot of readings over 200.  Please come back for a follow-up appointment in 2 months.  Please see a dietician specialist.  you will receive a phone call, about a day and time for an appointment.

## 2013-12-22 NOTE — Progress Notes (Signed)
Subjective:    Patient ID: Kaylee Lynn, female    DOB: 16-May-1961, 53 y.o.   MRN: [id number]  HPI Pt returns for f/u of type 2 DM (dx'ed 2008: she has mild if any neuropathy of the lower extremities; no known associated chronic complications; pt started on insulin in march of 2015; she chose qd insulin; she has never had pancreatitis, severe hypoglycemia, or DKA).  no cbg record, but states cbg's vary from 150-326.  She says it is highest in the afternoon. She denies hypoglycemia.   Past Medical History  Diagnosis Date  . Type I (juvenile type) diabetes mellitus without mention of complication, not stated as uncontrolled     No past surgical history on file.  History   Social History  . Marital Status: Single    Spouse Name: N/A    Number of Children: N/A  . Years of Education: N/A   Occupational History  . Not on file.   Social History Main Topics  . Smoking status: Unknown If Ever Smoked  . Smokeless tobacco: Not on file  . Alcohol Use: Not on file  . Drug Use: Not on file  . Sexual Activity: Not on file   Other Topics Concern  . Not on file   Social History Narrative  . No narrative on file    Current Outpatient Prescriptions on File Prior to Visit  Medication Sig Dispense Refill  . B-D ULTRAFINE III SHORT PEN 31G X 8 MM MISC       . Blood Glucose Monitoring Suppl (ONETOUCH VERIO IQ SYSTEM) W/DEVICE KIT 1 Device by Does not apply route once.  1 kit  0  . losartan-hydrochlorothiazide (HYZAAR) 100-25 MG per tablet Take 1 tablet by mouth daily.  30 tablet  11  . triamcinolone cream (KENALOG) 0.1 % Apply 1 application topically 3 (three) times daily. As needed for itching or rash  45 g  4   No current facility-administered medications on file prior to visit.    No Known Allergies  No family history on file.  BP 132/80  Pulse 82  Temp(Src) 98.3 F (36.8 C) (Oral)  Ht _0  (1.499 m)  Wt 211 lb (95.709 kg)  BMI 42.59 kg/m2  SpO2 95%  Review of  Systems She has weight gain.  Denies n/v.     Objective:   Physical Exam VITAL SIGNS:  See vs page GENERAL: no distress Pulses: dorsalis pedis intact bilat.   Feet: no deformity. normal color and temp.  no edema Skin:  no ulcer on the feet.   Neuro: sensation is intact to touch on the feet   Lab Results  Component Value Date   HGBA1C 16.0 Repeated and verified X2.* 09/22/2013       Assessment & Plan:  DM: based on the pattern of cbg's, she needs a slower-acting insulin.   Obesity: this complicates the rx of DM.   Patient is advised the following: Patient Instructions  Please change levemir to "lantus," 40 units each morning.   check your blood sugar twice a day.  vary the time of day when you check, between before the 3 meals, and at bedtime.  also check if you have symptoms of your blood sugar being too high or too low.  please keep a record of the readings and bring it to your next appointment here.  You can write it on any piece of paper.  please call us sooner if your blood sugar goes below 70, or  if you have a lot of readings over 200.  Please come back for a follow-up appointment in 2 months.  Please see a dietician specialist.  you will receive a phone call, about a day and time for an appointment.

## 2013-12-24 ENCOUNTER — Encounter: Payer: Self-pay | Admitting: Endocrinology

## 2014-01-18 ENCOUNTER — Telehealth: Payer: Self-pay | Admitting: Endocrinology

## 2014-01-18 NOTE — Telephone Encounter (Signed)
Pt advised that Dr. Loanne Drilling is out of the office this week and that she would have to be seen for medication to be prescribed. Pt advised via voicemail to seek care at [facility].

## 2014-01-18 NOTE — Telephone Encounter (Signed)
Patient states her allergies have been pretty bad  She would like a rx called in if she can    Thank you

## 2014-01-23 ENCOUNTER — Other Ambulatory Visit: Payer: Self-pay | Admitting: Endocrinology

## 2014-02-23 ENCOUNTER — Ambulatory Visit: Payer: Managed Care, Other (non HMO) | Admitting: Endocrinology

## 2014-04-23 ENCOUNTER — Ambulatory Visit (INDEPENDENT_AMBULATORY_CARE_PROVIDER_SITE_OTHER): Payer: Managed Care, Other (non HMO) | Admitting: Endocrinology

## 2014-04-23 ENCOUNTER — Encounter: Payer: Self-pay | Admitting: Endocrinology

## 2014-04-23 ENCOUNTER — Ambulatory Visit
Admission: RE | Admit: 2014-04-23 | Discharge: 2014-04-23 | Disposition: A | Discharge status: Home / Self Care | Payer: Managed Care, Other (non HMO) | Source: Ambulatory Visit | Attending: Endocrinology | Admitting: Endocrinology

## 2014-04-23 VITALS — BP 130/88 | HR 74 | Temp 98.7°F | Ht 59.0 in | Wt 225.0 lb

## 2014-04-23 DIAGNOSIS — R059 Cough, unspecified: Secondary | ICD-10-CM

## 2014-04-23 DIAGNOSIS — R05 Cough: Secondary | ICD-10-CM

## 2014-04-23 DIAGNOSIS — E119 Type 2 diabetes mellitus without complications: Secondary | ICD-10-CM

## 2014-04-23 LAB — HEMOGLOBIN A1C: HEMOGLOBIN A1C: 9.5 % — AB (ref 4.6–6.5)

## 2014-04-23 MED ORDER — PROMETHAZINE-CODEINE 6.25-10 MG/5ML PO SYRP: 5.0000 mL | ORAL_SOLUTION | ORAL | Status: DC | PRN

## 2014-04-23 MED ORDER — FLUTICASONE-SALMETEROL 100-50 MCG/DOSE IN AEPB: 1.0000 | INHALATION_SPRAY | Freq: Two times a day (BID) | RESPIRATORY_TRACT | Status: DC

## 2014-04-23 MED ORDER — AZITHROMYCIN 500 MG PO TABS: 500.0000 mg | ORAL_TABLET | Freq: Every day | ORAL | Status: DC

## 2014-04-23 NOTE — Patient Instructions (Addendum)
check your blood sugar twice a day.  vary the time of day when you check, between before the 3 meals, and at bedtime.  also check if you have symptoms of your blood sugar being too high or too low.  please keep a record of the readings and bring it to your next appointment here.  You can write it on any piece of paper.  please call us sooner if your blood sugar goes below 70, or if you have a lot of readings over 200.  Please come back for a follow-up appointment in 3 months.   A blood test and a chest x-ray are being requested for you today.  We'll contact you with results.   Here is a prescription for cough syrup.

## 2014-04-23 NOTE — Progress Notes (Signed)
Subjective:    Patient ID: Kaylee Lynn, female    DOB: 07/10/1960, 53 y.o.   MRN: [id number]  HPI Pt returns for f/u of diabetes mellitus: DM type: Insulin-requiring type 2 Dx'ed: 2458 Complications: none Therapy: insulin since 2015 GDM: never DKA: never Severe hypoglycemia: never Pancreatitis: never Other: she declined bariatric surgery; she chose qd insulin.   Interval history: She did not change levemir to lantus, due to cost.  no cbg record, but states cbg's vary from 119-200's.  It is in general higher as the day goes on.  Pt says she seldom misses the insulin.   Pt states 1 month of slight dry cough in the chest, and assoc slight nasal congestion.  Allegra did not help.  She has slight wheezing.   Past Medical History  Diagnosis Date  . Type I (juvenile type) diabetes mellitus without mention of complication, not stated as uncontrolled     No past surgical history on file.  History   Social History  . Marital Status: Single    Spouse Name: N/A    Number of Children: N/A  . Years of Education: N/A   Occupational History  . Not on file.   Social History Main Topics  . Smoking status: Unknown If Ever Smoked  . Smokeless tobacco: Not on file  . Alcohol Use: Not on file  . Drug Use: Not on file  . Sexual Activity: Not on file   Other Topics Concern  . Not on file   Social History Narrative  . No narrative on file    Current Outpatient Prescriptions on File Prior to Visit  Medication Sig Dispense Refill  . B-D ULTRAFINE III SHORT PEN 31G X 8 MM MISC       . Blood Glucose Monitoring Suppl (ONETOUCH VERIO IQ SYSTEM) W/DEVICE KIT 1 Device by Does not apply route once.  1 kit  0  . glucose blood (ONETOUCH VERIO) test strip 1 each by Other route 2 (two) times daily. And lancets 2/day 250.01  100 each  12  . losartan-hydrochlorothiazide (HYZAAR) 100-25 MG per tablet Take 1 tablet by mouth daily.  30 tablet  11  . triamcinolone cream (KENALOG) 0.1 % APPLY 1  APPLICATION 3 (THREE) TIMES DAILY. AS NEEDED FOR ITCHING OR RASH  45 g  4   No current facility-administered medications on file prior to visit.    No Known Allergies  No family history on file.  BP 130/88  Pulse 74  Temp(Src) 98.7 F (37.1 C) (Oral)  Ht 4' 11"  (1.499 m)  Wt 225 lb (102.059 kg)  BMI 45.42 kg/m2  SpO2 98%    Review of Systems .she denies hypoglycemia and fever.  She has weight gain.    Objective:   Physical Exam VITAL SIGNS:  See vs page GENERAL: no distress head: no deformity eyes: no periorbital swelling, no proptosis.   external nose and ears are normal mouth: no lesion seen Both eac's and tm's are normal LUNGS:  Clear to auscultation Pulses: dorsalis pedis intact bilat.   Feet: no deformity.  no edema Skin:  no ulcer on the feet.  normal color and temp. Neuro: sensation is intact to touch on the feet    CXR: NAD    Assessment & Plan:  Cough, new, prob due to URI DM: moderate exacerbation.     Patient is advised the following: Patient Instructions  check your blood sugar twice a day.  vary the time of day when you  check, between before the 3 meals, and at bedtime.  also check if you have symptoms of your blood sugar being too high or too low.  please keep a record of the readings and bring it to your next appointment here.  You can write it on any piece of paper.  please call us sooner if your blood sugar goes below 70, or if you have a lot of readings over 200.  Please come back for a follow-up appointment in 3 months.   A blood test and a chest x-ray are being requested for you today.  We'll contact you with results.   Here is a prescription for cough syrup.

## 2014-07-07 ENCOUNTER — Other Ambulatory Visit: Payer: Self-pay

## 2014-07-07 MED ORDER — TRIAMCINOLONE ACETONIDE 0.1 % EX CREA: TOPICAL_CREAM | CUTANEOUS | Status: DC

## 2014-07-23 ENCOUNTER — Encounter: Payer: Self-pay | Admitting: Endocrinology

## 2014-07-23 ENCOUNTER — Ambulatory Visit (INDEPENDENT_AMBULATORY_CARE_PROVIDER_SITE_OTHER): Payer: Managed Care, Other (non HMO) | Admitting: Endocrinology

## 2014-07-23 VITALS — BP 128/80 | HR 78 | Temp 97.8°F | Ht 59.0 in | Wt 224.0 lb

## 2014-07-23 DIAGNOSIS — E119 Type 2 diabetes mellitus without complications: Secondary | ICD-10-CM

## 2014-07-23 LAB — HEMOGLOBIN A1C

## 2014-07-23 MED ORDER — INSULIN DETEMIR 100 UNIT/ML FLEXPEN: PEN_INJECTOR | SUBCUTANEOUS | Status: DC

## 2014-07-23 NOTE — Patient Instructions (Addendum)
check your blood sugar twice a day.  vary the time of day when you check, between before the 3 meals, and at bedtime.  also check if you have symptoms of your blood sugar being too high or too low.  please keep a record of the readings and bring it to your next appointment here.  You can write it on any piece of paper.  please call us sooner if your blood sugar goes below 70, or if you have a lot of readings over 200.  Please come back for a regular physical appointment in 3 months.   blood tests are being requested for you today.  We'll let you know about the results.   Please see a dietician specialist.  you will receive a phone call, about a day and time for an appointment.

## 2014-07-23 NOTE — Progress Notes (Signed)
Subjective:    Patient ID: Kaylee Lynn, female    DOB: 12/21/1960, 54 y.o.   MRN: [id number]  HPI Pt returns for f/u of diabetes mellitus: DM type: Insulin-requiring type 2 Dx'ed: 2993 Complications: none Therapy: insulin since 2015 GDM: never. DKA: never Severe hypoglycemia: never. Pancreatitis: never. Other: she declined bariatric surgery; she chose qd insulin.   Interval history: She did not change levemir to lantus, due to cost.  no cbg record, but states cbg's vary from 130-200.  It is in general higher as the day goes on.  Pt says she never misses the insulin.  Past Medical History  Diagnosis Date  . Type I (juvenile type) diabetes mellitus without mention of complication, not stated as uncontrolled     No past surgical history on file.  History   Social History  . Marital Status: Single    Spouse Name: N/A    Number of Children: N/A  . Years of Education: N/A   Occupational History  . Not on file.   Social History Main Topics  . Smoking status: Unknown If Ever Smoked  . Smokeless tobacco: Not on file  . Alcohol Use: Not on file  . Drug Use: Not on file  . Sexual Activity: Not on file   Other Topics Concern  . Not on file   Social History Narrative    Current Outpatient Prescriptions on File Prior to Visit  Medication Sig Dispense Refill  . B-D ULTRAFINE III SHORT PEN 31G X 8 MM MISC     . Blood Glucose Monitoring Suppl (ONETOUCH VERIO IQ SYSTEM) W/DEVICE KIT 1 Device by Does not apply route once. 1 kit 0  . Fluticasone-Salmeterol (ADVAIR DISKUS) 100-50 MCG/DOSE AEPB Inhale 1 puff into the lungs 2 (two) times daily. 1 each 0  . glucose blood (ONETOUCH VERIO) test strip 1 each by Other route 2 (two) times daily. And lancets 2/day 250.01 100 each 12  . losartan-hydrochlorothiazide (HYZAAR) 100-25 MG per tablet Take 1 tablet by mouth daily. 30 tablet 11  . promethazine-codeine (PHENERGAN WITH CODEINE) 6.25-10 MG/5ML syrup Take 5 mLs by mouth every 4  (four) hours as needed. 240 mL 1  . triamcinolone cream (KENALOG) 0.1 % APPLY 1 APPLICATION 3 (THREE) TIMES DAILY. AS NEEDED FOR ITCHING OR RASH 45 g 4   No current facility-administered medications on file prior to visit.    No Known Allergies  No family history on file.  BP 128/80 mmHg  Pulse 78  Temp(Src) 97.8 F (36.6 C) (Oral)  Ht 4' 11"  (1.499 m)  Wt 224 lb (101.606 kg)  BMI 45.22 kg/m2  SpO2 97%  Review of Systems She denies hypoglycemia and weight change.      Objective:   Physical Exam VITAL SIGNS:  See vs page GENERAL: no distress Pulses: dorsalis pedis intact bilat.   MSK: no deformity of the feet.   CV: no leg edema.   Skin:  no ulcer on the feet.  normal color and temp on the feet. Neuro: sensation is intact to touch on the feet.    Lab Results  Component Value Date   HGBA1C 12.4 Repeated and verified X2.* 07/23/2014       Assessment & Plan:  DM: severe exacerbation Noncompliance with cbg recording: I'll work around this as best I can, but this will be difficult, because a1c is much higher than would be suggested by reported cbg's.    Patient is advised the following: Patient Instructions  check your  blood sugar twice a day.  vary the time of day when you check, between before the 3 meals, and at bedtime.  also check if you have symptoms of your blood sugar being too high or too low.  please keep a record of the readings and bring it to your next appointment here.  You can write it on any piece of paper.  please call us sooner if your blood sugar goes below 70, or if you have a lot of readings over 200.  Please come back for a regular physical appointment in 3 months.   blood tests are being requested for you today.  We'll let you know about the results.   Please see a dietician specialist.  you will receive a phone call, about a day and time for an appointment.   increase the levemir to 100 units each morning and 10 units each evening

## 2014-08-13 ENCOUNTER — Encounter: Payer: Managed Care, Other (non HMO) | Attending: Endocrinology | Admitting: Dietician

## 2014-08-13 ENCOUNTER — Encounter: Payer: Self-pay | Admitting: Dietician

## 2014-08-13 VITALS — Ht 59.0 in | Wt 228.0 lb

## 2014-08-13 DIAGNOSIS — Z794 Long term (current) use of insulin: Secondary | ICD-10-CM | POA: Insufficient documentation

## 2014-08-13 DIAGNOSIS — Z713 Dietary counseling and surveillance: Secondary | ICD-10-CM | POA: Insufficient documentation

## 2014-08-13 DIAGNOSIS — E119 Type 2 diabetes mellitus without complications: Secondary | ICD-10-CM

## 2014-08-13 NOTE — Patient Instructions (Signed)
Plan:  Aim for 2 Carb Choices per meal (30 grams) +/- 1 either way  Aim for 0-1 Carbs per snack if hungry  Include protein in moderation with your meals and snacks Consider reading food labels for Total Carbohydrate and Fat Grams of foods Consider  increasing your activity level by resistance training for 30 minutes daily as tolerated Consider checking BG at alternate times per day as directed by MD  Continue taking medication as directed by MD  Be mindful of portion sizes especially nuts and popcorn. Increase vegetable intake to 1/2 your plate.

## 2014-08-13 NOTE — Progress Notes (Signed)
Medical Nutrition Therapy:  Appt start time: 0930 end time:  1030.   Assessment:  Primary concerns today: Patient went on insulin about 9 months ago and gained about 40 lbs and states that her blood sugar is not controlled.  Patient would like to improve blood sugar and lose weight.  She does not want bariatric surgery but wants to lose weight by following a plan.  States that she has bought MANY books about controlling diabetes and is now very confused.  Her )ne Touch Verio was crushed in air travel last week.  Patient has another meter but prefers this one and plans to purchase a new one today.    Patient lives with sister and nephew.  Patient and shops and cooks for herself. She works at 2 jobs.  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  Preferred Learning Style:   No preference indicated  Learning Readiness:   Ready  Change in progress  MEDICATIONS: see list   DIETARY INTAKE: 24-hr recall:  B ( AM): whey protein shake or Dannon lite yogurt, black coffee or a packet of oatmeal or cereal and milk.  If off work she has bacon (2 strips decreased from 5), [employer] muffin with light cream cheese, boiled egg.  Snk ( AM): bag of pork skins or nuts (1 pack), or popcorn L ( PM): leftovers from dinner or chic fillet (kale/brocolini salad with 1/2 of a wrap or chicken nuggets) or [employer], or wings Snk ( PM): none D ( PM): baked/broiled meat, rice or sweet potato, veges, fruit or take out (PF Changs), Maxi B's or skinny cow Snk ( PM): lite yogurt at times. Beverages: black coffee, crystal lite, sprite zero, water,   Usual physical activity: walk at least 5 miles per day at work.  When the weather is good she will walk about 3 miles per day but not recently due to the weather.  She has treadmill, glider, resistance equipment, zumba and other exercise tapes.  Prefers to exercise by herself usually.  Occasional dance class.    Estimated energy  needs: 1200 calories 135 g carbohydrates 75 g protein 40 g fat  Progress Towards Goal(s):  In progress.   Nutritional Diagnosis:  NB-1.1 Food and nutrition-related knowledge deficit As related to balance of carbohydrates, protein, and fats.  As evidenced by patient report.    Intervention:  Nutrition counseling and diabetes education initiated. Discussed Carb Counting by food group as method of portion control, reading food labels, and benefits of increased activity.  Plan:  Aim for 2 Carb Choices per meal (30 grams) +/- 1 either way  Aim for 0-1 Carbs per snack if hungry  Include protein in moderation with your meals and snacks Consider reading food labels for Total Carbohydrate and Fat Grams of foods Consider  increasing your activity level by resistance training for 30 minutes daily as tolerated Consider checking BG at alternate times per day as directed by MD  Continue taking medication as directed by MD  Be mindful of portion sizes especially nuts and popcorn. Increase vegetable intake to 1/2 your plate.  Teaching Method Utilized:  Visual Auditory Hands on  Handouts given during visit include:  My plate placemat  Meal plan card  Snack list  Label reading  Barriers to learning/adherence to lifestyle change: none  Demonstrated degree of understanding via:  Teach Back .    Monitoring/Evaluation:  Dietary intake, exercise, label reading, and body weight  in 1 month(s).

## 2014-09-06 ENCOUNTER — Other Ambulatory Visit: Payer: Self-pay | Admitting: Endocrinology

## 2014-09-25 ENCOUNTER — Other Ambulatory Visit: Payer: Self-pay | Admitting: Endocrinology

## 2014-09-27 ENCOUNTER — Other Ambulatory Visit: Payer: Self-pay | Admitting: *Deleted

## 2014-10-04 ENCOUNTER — Other Ambulatory Visit: Payer: Self-pay | Admitting: Endocrinology

## 2014-10-05 ENCOUNTER — Other Ambulatory Visit: Payer: Self-pay | Admitting: Endocrinology

## 2014-10-05 ENCOUNTER — Telehealth: Payer: Self-pay | Admitting: Endocrinology

## 2014-10-05 NOTE — Telephone Encounter (Signed)
Crystal at [facility] want to clarify if medication direction is correct, Flexpen   Sig: 100 units in the morning and 10 units in the evening. Please advise

## 2014-10-05 NOTE — Telephone Encounter (Signed)
Pharmacy notified that the Levemir Rx for 100 units and 10 units is correct.

## 2014-10-22 ENCOUNTER — Ambulatory Visit: Payer: Managed Care, Other (non HMO) | Admitting: Dietician

## 2014-10-22 ENCOUNTER — Ambulatory Visit: Payer: Managed Care, Other (non HMO) | Admitting: Endocrinology

## 2014-10-23 ENCOUNTER — Other Ambulatory Visit: Payer: Self-pay | Admitting: Endocrinology

## 2014-11-19 ENCOUNTER — Ambulatory Visit (INDEPENDENT_AMBULATORY_CARE_PROVIDER_SITE_OTHER): Payer: Managed Care, Other (non HMO) | Admitting: Endocrinology

## 2014-11-19 ENCOUNTER — Encounter: Payer: Self-pay | Admitting: Endocrinology

## 2014-11-19 ENCOUNTER — Encounter: Payer: Managed Care, Other (non HMO) | Attending: Endocrinology | Admitting: Dietician

## 2014-11-19 VITALS — BP 132/90 | HR 79 | Temp 98.0°F | Ht 59.0 in | Wt 227.0 lb

## 2014-11-19 DIAGNOSIS — Z0189 Encounter for other specified special examinations: Secondary | ICD-10-CM

## 2014-11-19 DIAGNOSIS — E119 Type 2 diabetes mellitus without complications: Secondary | ICD-10-CM

## 2014-11-19 DIAGNOSIS — Z794 Long term (current) use of insulin: Secondary | ICD-10-CM | POA: Diagnosis not present

## 2014-11-19 DIAGNOSIS — Z0000 Encounter for general adult medical examination without abnormal findings: Secondary | ICD-10-CM | POA: Insufficient documentation

## 2014-11-19 DIAGNOSIS — Z713 Dietary counseling and surveillance: Secondary | ICD-10-CM | POA: Insufficient documentation

## 2014-11-19 LAB — CBC WITH DIFFERENTIAL/PLATELET
BASOS ABS: 0 10*3/uL (ref 0.0–0.1)
Basophils Relative: 0.5 % (ref 0.0–3.0)
Eosinophils Absolute: 0.4 10*3/uL (ref 0.0–0.7)
Eosinophils Relative: 4.5 % (ref 0.0–5.0)
HEMATOCRIT: 43.2 % (ref 36.0–46.0)
Hemoglobin: 13.8 g/dL (ref 12.0–15.0)
Lymphocytes Relative: 36.8 % (ref 12.0–46.0)
Lymphs Abs: 3 10*3/uL (ref 0.7–4.0)
MCHC: 31.9 g/dL (ref 30.0–36.0)
MCV: 79.6 fl (ref 78.0–100.0)
Monocytes Absolute: 0.5 10*3/uL (ref 0.1–1.0)
Monocytes Relative: 5.5 % (ref 3.0–12.0)
NEUTROS ABS: 4.3 10*3/uL (ref 1.4–7.7)
Neutrophils Relative %: 52.7 % (ref 43.0–77.0)
PLATELETS: 222 10*3/uL (ref 150.0–400.0)
RBC: 5.42 Mil/uL — ABNORMAL HIGH (ref 3.87–5.11)
RDW: 13.4 % (ref 11.5–15.5)
WBC: 8.2 10*3/uL (ref 4.0–10.5)

## 2014-11-19 LAB — BASIC METABOLIC PANEL
BUN: 12 mg/dL (ref 6–23)
CO2: 30 mEq/L (ref 19–32)
Calcium: 9.7 mg/dL (ref 8.4–10.5)
Chloride: 100 mEq/L (ref 96–112)
Creatinine, Ser: 0.6 mg/dL (ref 0.40–1.20)
GFR: 132.89 mL/min (ref >60.00)
Glucose, Bld: 204 mg/dL — ABNORMAL HIGH (ref 70–99)
Potassium: 3.9 mEq/L (ref 3.5–5.1)
Sodium: 134 mEq/L — ABNORMAL LOW (ref 135–145)

## 2014-11-19 LAB — URINALYSIS, ROUTINE W REFLEX MICROSCOPIC
BILIRUBIN URINE: NEGATIVE
Hgb urine dipstick: NEGATIVE
KETONES UR: NEGATIVE
LEUKOCYTES UA: NEGATIVE
Nitrite: NEGATIVE
PH: 6 (ref 5.0–8.0)
Specific Gravity, Urine: 1.025 (ref 1.000–1.030)
Total Protein, Urine: NEGATIVE
URINE GLUCOSE: NEGATIVE
Urobilinogen, UA: 0.2 (ref 0.0–1.0)

## 2014-11-19 LAB — MICROALBUMIN / CREATININE URINE RATIO
CREATININE, U: 154.6 mg/dL
Microalb Creat Ratio: 2.2 mg/g (ref 0.0–30.0)
Microalb, Ur: 3.4 mg/dL — ABNORMAL HIGH (ref 0.0–1.9)

## 2014-11-19 LAB — HEMOGLOBIN A1C: HEMOGLOBIN A1C: 10.7 % — AB (ref 4.6–6.5)

## 2014-11-19 LAB — LIPID PANEL
CHOL/HDL RATIO: 3
Cholesterol: 161 mg/dL (ref 0–200)
HDL: 60.8 mg/dL (ref >39.00)
LDL CALC: 91 mg/dL (ref 0–99)
NONHDL: 100.2
Triglycerides: 47 mg/dL (ref 0.0–149.0)
VLDL: 9.4 mg/dL (ref 0.0–40.0)

## 2014-11-19 LAB — HEPATIC FUNCTION PANEL
ALT: 43 U/L — ABNORMAL HIGH (ref 0–35)
AST: 28 U/L (ref 0–37)
Albumin: 4 g/dL (ref 3.5–5.2)
Alkaline Phosphatase: 85 U/L (ref 39–117)
BILIRUBIN DIRECT: 0.1 mg/dL (ref 0.0–0.3)
BILIRUBIN TOTAL: 0.4 mg/dL (ref 0.2–1.2)
TOTAL PROTEIN: 7.8 g/dL (ref 6.0–8.3)

## 2014-11-19 LAB — TSH: TSH: 1.3 u[IU]/mL (ref 0.35–4.50)

## 2014-11-19 MED ORDER — INSULIN DETEMIR 100 UNIT/ML FLEXPEN: PEN_INJECTOR | SUBCUTANEOUS | Status: DC

## 2014-11-19 MED ORDER — TRIAMCINOLONE ACETONIDE 0.1 % EX CREA: TOPICAL_CREAM | CUTANEOUS | Status: DC

## 2014-11-19 MED ORDER — INSULIN DETEMIR 100 UNIT/ML FLEXPEN: 130.0000 [IU] | PEN_INJECTOR | SUBCUTANEOUS | Status: DC

## 2014-11-19 NOTE — Patient Instructions (Addendum)
check your blood sugar twice a day.  vary the time of day when you check, between before the 3 meals, and at bedtime.  also check if you have symptoms of your blood sugar being too high or too low.  please keep a record of the readings and bring it to your next appointment here.  You can write it on any piece of paper.  please call us sooner if your blood sugar goes below 70, or if you have a lot of readings over 200.  Please come back for a regular physical appointment in 3 months.   [phone], to make an appointment for a mammogram.   blood tests are being requested for you today.  We'll let you know about the results.

## 2014-11-19 NOTE — Progress Notes (Signed)
Medical Nutrition Therapy:  Appt start time: 0930 end time:  1030.   Assessment:  08/13/14 Primary concerns today: Patient went on insulin about 9 months ago and gained about 40 lbs and states that her blood sugar is not controlled.  Patient would like to improve blood sugar and lose weight.  She does not want bariatric surgery but wants to lose weight by following a plan.  States that she has bought MANY books about controlling diabetes and is now very confused.  Her One Touch Verio was crushed in air travel last week.  Patient has another meter but prefers this one and plans to purchase a new one today.    Patient lives with sister and nephew.  Patient and shops and cooks for herself. She works at 2 jobs.  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  11/19/14: Wt Readings from Last 3 Encounters:  11/19/14 227 lb (102.967 kg)  08/13/14 228 lb (103.42 kg)  07/23/14 224 lb (101.606 kg)  Weight is stable.  Patient would like to lose weight.  At this time she has prevented further weight gain.  (She had gained 40 lbs prior to last visit.)  She has traveled a lot and found challenges with eating out and family reunions.  States that she is now eating much better and is keeping carbohydrates to 135 gm per day, has decreased fat and is eating more vegetables. No new HgbA1C.  She is getting labs checked today.  She feels that she can remain motivated without f/u.  Encouraged her to call if questions.  Preferred Learning Style:   No preference indicated  Learning Readiness:   Ready  Change in progress  MEDICATIONS: see list   DIETARY INTAKE: 24-hr recall:  B ( AM): whey protein shake or Dannon lite yogurt, black coffee or a packet of oatmeal or cereal and milk.  If off work she has bacon (2 strips decreased from 5), [employer] muffin with light cream cheese, boiled egg.  Snk ( AM): bag of pork skins or nuts (1 pack), or popcorn L ( PM):  leftovers from dinner or chic fillet (kale/brocolini salad with 1/2 of a wrap or chicken nuggets) or [employer], or wings Snk ( PM): none D ( PM): baked/broiled meat, rice or sweet potato, veges, fruit or take out (PF Changs), Maxi B's or skinny cow Snk ( PM): lite yogurt at times. Beverages: black coffee, crystal lite, sprite zero, water,   Usual physical activity: walk at least 5 miles per day at work.  When the weather is good she will walk about 3 miles per day but not recently due to the weather.  She has treadmill, glider, resistance equipment, zumba and other exercise tapes.  Prefers to exercise by herself usually.  Occasional dance class.    Estimated energy needs: 1200 calories 135 g carbohydrates 75 g protein 40 g fat  Progress Towards Goal(s):  In progress.   Nutritional Diagnosis:  NB-1.1 Food and nutrition-related knowledge deficit As related to balance of carbohydrates, protein, and fats.  As evidenced by patient report.    Intervention:  Nutrition counseling and diabetes education continued.  Discussed importance of continued low fat choices and being mindful of appetite and portion sizes.  Start with protein and vegetables.  Patient discussed drinking coffee black as she does not like non fat milk or almond milk in coffee and was using cream for "comfort" and drank coffee black later  in the day.  Discussed decreasing oil usage as well.  Discussed tips to deal with social situations.  Plan:  Great job on the changes that you have made!  You have been mindful about carbohydrate intake, decreasing fat content and increasing your vegetable intake. Aim for 2 Carb Choices per meal (30 grams) +/- 1 either way  Aim for 0-1 Carbs per snack if hungry  Include protein in moderation with your meals and snacks Consider reading food labels for Total Carbohydrate and Fat Grams of foods   Continue the exercise every day!  Enjoy! Teaching Method Utilized:  Visual Auditory Hands on  Handouts  given during visit include:  My plate placemat  Meal plan card  Snack list  Label reading  Barriers to learning/adherence to lifestyle change: none  Demonstrated degree of understanding via:  Teach Back .    Monitoring/Evaluation:  Dietary intake, exercise, label reading, and body weight prn.

## 2014-11-19 NOTE — Progress Notes (Signed)
Subjective:    Patient ID: Kaylee Lynn, female    DOB: September 14, 1960, 54 y.o.   MRN: [id number]  HPI Pt returns for f/u of diabetes mellitus: DM type: Insulin-requiring type 2 Dx'ed: 6269 Complications: none Therapy: insulin since 2015 GDM: never. DKA: never Severe hypoglycemia: never. Pancreatitis: never. Other: she declined bariatric surgery; she chose a simple insulin schedule; ins did not pay for lantus. Interval history: no cbg record, but states cbg's vary from 178-200's.  There is no trend throughout the day.pt states she feels well in general.  She takes 70 units qam and 30 units qpm.  Pt says she never misses the insulin.   Past Medical History  Diagnosis Date  . Type I (juvenile type) diabetes mellitus without mention of complication, not stated as uncontrolled   . Hypertension   . Allergy     No past surgical history on file.  History   Social History  . Marital Status: Single    Spouse Name: N/A  . Number of Children: N/A  . Years of Education: N/A   Occupational History  . Not on file.   Social History Main Topics  . Smoking status: Unknown If Ever Smoked  . Smokeless tobacco: Not on file  . Alcohol Use: Not on file  . Drug Use: Not on file  . Sexual Activity: Not on file   Other Topics Concern  . Not on file   Social History Narrative    Current Outpatient Prescriptions on File Prior to Visit  Medication Sig Dispense Refill  . B-D ULTRAFINE III SHORT PEN 31G X 8 MM MISC     . Blood Glucose Monitoring Suppl (ONETOUCH VERIO IQ SYSTEM) W/DEVICE KIT 1 Device by Does not apply route once. 1 kit 0  . Fluticasone-Salmeterol (ADVAIR DISKUS) 100-50 MCG/DOSE AEPB Inhale 1 puff into the lungs 2 (two) times daily. 1 each 0  . glucose blood (ONETOUCH VERIO) test strip 1 each by Other route 2 (two) times daily. And lancets 2/day 250.01 100 each 12  . losartan-hydrochlorothiazide (HYZAAR) 100-25 MG per tablet TAKE 1 TABLET BY MOUTH DAILY. 30 tablet 9   No  current facility-administered medications on file prior to visit.    No Known Allergies  No family history on file.  BP 132/90 mmHg  Pulse 79  Temp(Src) 98 F (36.7 C) (Oral)  Ht 4' 11"  (1.499 m)  Wt 227 lb (102.967 kg)  BMI 45.82 kg/m2  SpO2 95%   Review of Systems She denies hypoglycemia    Objective:   Physical Exam VITAL SIGNS:  See vs page GENERAL: no distress Pulses: dorsalis pedis intact bilat.   MSK: no deformity of the feet CV: no leg edema Skin:  no ulcer on the feet.  normal color and temp on the feet. Neuro: sensation is intact to touch on the feet.  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  Please continue the same medication for now--we'll follow.     Patient is advised the following: Patient Instructions  check your blood sugar twice a day.  vary the time of day when you check, between before the 3 meals, and at bedtime.  also check if you have symptoms of your blood sugar being too high or too low.  please keep a record of the readings and bring it to your next appointment here.  You can write it on any piece of paper.  please call us sooner if your blood sugar goes below 70, or if you have a lot of readings over 200.  Please come back for a regular physical appointment in 3 months.   [phone], to make an appointment for a mammogram.     blood tests are being requested for you today.  We'll let you know about the results.     Addendum: increase insulin to 130 units qam We'll recheck LFT at next ov

## 2014-11-19 NOTE — Patient Instructions (Signed)
Great job on the changes that you have made!  You have been mindful about carbohydrate intake, decreasing fat content and increasing your vegetable intake.   Continue the exercise every day!  Enjoy!

## 2014-12-20 ENCOUNTER — Encounter: Payer: Self-pay | Admitting: Gastroenterology

## 2015-01-01 ENCOUNTER — Other Ambulatory Visit: Payer: Self-pay | Admitting: Endocrinology

## 2015-01-05 ENCOUNTER — Other Ambulatory Visit: Payer: Self-pay | Admitting: Endocrinology

## 2015-01-11 ENCOUNTER — Other Ambulatory Visit: Payer: Self-pay | Admitting: Endocrinology

## 2015-01-19 ENCOUNTER — Other Ambulatory Visit: Payer: Self-pay | Admitting: Endocrinology

## 2015-02-21 ENCOUNTER — Ambulatory Visit: Payer: Managed Care, Other (non HMO) | Admitting: Endocrinology

## 2015-07-08 ENCOUNTER — Telehealth: Payer: Self-pay | Admitting: Endocrinology

## 2015-07-08 NOTE — Telephone Encounter (Signed)
please call patient: cpx is due 

## 2015-07-11 NOTE — Telephone Encounter (Signed)
Appointment letter mailed to the pt.  

## 2015-08-13 ENCOUNTER — Other Ambulatory Visit: Payer: Self-pay | Admitting: Endocrinology

## 2015-08-19 ENCOUNTER — Ambulatory Visit: Payer: Managed Care, Other (non HMO) | Admitting: Endocrinology

## 2015-08-22 ENCOUNTER — Telehealth: Payer: Self-pay | Admitting: Endocrinology

## 2015-08-22 NOTE — Telephone Encounter (Signed)
Patient no showed today's appt. Please advise on how to follow up. °A. No follow up necessary. °B. Follow up urgent. Contact patient immediately. °C. Follow up necessary. Contact patient and schedule visit in ___ days. °D. Follow up advised. Contact patient and schedule visit in ____weeks. ° °

## 2015-08-22 NOTE — Telephone Encounter (Signed)
Please come back for a follow-up appointment in 2 weeks 

## 2015-08-23 NOTE — Telephone Encounter (Signed)
Caitlin, Could you please contact the pt and reschedule. Thanks! 

## 2015-09-02 ENCOUNTER — Encounter: Payer: Self-pay | Admitting: Endocrinology

## 2015-09-02 ENCOUNTER — Encounter: Payer: Self-pay | Admitting: Gastroenterology

## 2015-09-02 ENCOUNTER — Ambulatory Visit (INDEPENDENT_AMBULATORY_CARE_PROVIDER_SITE_OTHER): Payer: Managed Care, Other (non HMO) | Admitting: Endocrinology

## 2015-09-02 VITALS — BP 152/86 | HR 70 | Temp 96.0°F | Ht 59.0 in | Wt 209.0 lb

## 2015-09-02 DIAGNOSIS — Z0189 Encounter for other specified special examinations: Secondary | ICD-10-CM | POA: Diagnosis not present

## 2015-09-02 DIAGNOSIS — Z0000 Encounter for general adult medical examination without abnormal findings: Secondary | ICD-10-CM

## 2015-09-02 DIAGNOSIS — E119 Type 2 diabetes mellitus without complications: Secondary | ICD-10-CM | POA: Diagnosis not present

## 2015-09-02 LAB — POCT GLYCOSYLATED HEMOGLOBIN (HGB A1C): Hemoglobin A1C: 15

## 2015-09-02 MED ORDER — BASAGLAR KWIKPEN 100 UNIT/ML ~~LOC~~ SOPN: 100.0000 [IU] | PEN_INJECTOR | SUBCUTANEOUS | Status: DC

## 2015-09-02 NOTE — Patient Instructions (Addendum)
Please come back for a regular physical appointment in 2 months.  i have sent a prescription to your pharmacy, to start "basaglar."  check your blood sugar twice a day.  vary the time of day when you check, between before the 3 meals, and at bedtime.  also check if you have symptoms of your blood sugar being too high or too low.  please keep a record of the readings and bring it to your next appointment here (or you can bring the meter itself).  You can write it on any piece of paper.  please call us sooner if your blood sugar goes below 70, or if you have a lot of readings over 200. Please call us next week, to tell us how the blood sugar is doing.

## 2015-09-02 NOTE — Progress Notes (Signed)
Subjective:    Patient ID: Kaylee Lynn, female    DOB: Sep 10, 1960, 55 y.o.   MRN: [id number]  HPI Pt returns for f/u of diabetes mellitus: DM type: Insulin-requiring type 2 Dx'ed: 1497 Complications: none Therapy: insulin since 2015 GDM: never. DKA: never Severe hypoglycemia: never. Pancreatitis: never. Other: she declined bariatric surgery; she chose a simple insulin schedule; ins did not pay for lantus. Interval history: Pt says she took prednisone for acute bronchitis, and finished 2 weeks ago.  She has been out of the insulin x 1 month.  She has lost weight, due to her efforts.   Past Medical History  Diagnosis Date  . Type I (juvenile type) diabetes mellitus without mention of complication, not stated as uncontrolled   . Hypertension   . Allergy     No past surgical history on file.  Social History   Social History  . Marital Status: Single    Spouse Name: N/A  . Number of Children: N/A  . Years of Education: N/A   Occupational History  . Not on file.   Social History Main Topics  . Smoking status: Unknown If Ever Smoked  . Smokeless tobacco: Not on file  . Alcohol Use: Not on file  . Drug Use: Not on file  . Sexual Activity: Not on file   Other Topics Concern  . Not on file   Social History Narrative    Current Outpatient Prescriptions on File Prior to Visit  Medication Sig Dispense Refill  . B-D ULTRAFINE III SHORT PEN 31G X 8 MM MISC USE AS DIRECTED ONCE DAILY 100 each 2  . Blood Glucose Monitoring Suppl (ONETOUCH VERIO IQ SYSTEM) W/DEVICE KIT 1 Device by Does not apply route once. 1 kit 0  . Fluticasone-Salmeterol (ADVAIR DISKUS) 100-50 MCG/DOSE AEPB Inhale 1 puff into the lungs 2 (two) times daily. 1 each 0  . glucose blood (ONETOUCH VERIO) test strip 1 each by Other route 2 (two) times daily. And lancets 2/day 250.01 100 each 12  . losartan-hydrochlorothiazide (HYZAAR) 100-25 MG per tablet TAKE 1 TABLET BY MOUTH DAILY. 30 tablet 9  . ONETOUCH  DELICA LANCETS 02O MISC USE AS DIRECTED TWICE A DAY 100 each 8  . ONETOUCH VERIO test strip USE TWICE DAILY. 100 each 11  . triamcinolone cream (KENALOG) 0.1 % APPLY 1 APPLICATION 3 (THREE) TIMES DAILY. AS NEEDED FOR ITCHING OR RASH 45 g 4   No current facility-administered medications on file prior to visit.    No Known Allergies  No family history on file.  BP 152/86 mmHg  Pulse 70  Temp(Src) 96 F (35.6 C) (Oral)  Ht _0  (1.499 m)  Wt 209 lb (94.802 kg)  BMI 42.19 kg/m2  SpO2 98%  Review of Systems She denies hypoglycemia.      Objective:   Physical Exam VITAL SIGNS:  See vs page GENERAL: no distress Pulses: dorsalis pedis intact bilat.   MSK: no deformity of the feet CV: no leg edema Skin:  no ulcer on the feet.  normal color and temp on the feet. Neuro: sensation is intact to touch on the feet.     A1c=15%    Assessment & Plan:  DM: severe exacerbation Noncompliance with cbg recording, insulin, and f/u appts, worse: I'll work around this as best I can.  Weight loss: prob due to glycosuria.  We'll follow  Patient is advised the following: Patient Instructions  Please come back for a regular physical appointment in 2  months.  i have sent a prescription to your pharmacy, to start "basaglar."  check your blood sugar twice a day.  vary the time of day when you check, between before the 3 meals, and at bedtime.  also check if you have symptoms of your blood sugar being too high or too low.  please keep a record of the readings and bring it to your next appointment here (or you can bring the meter itself).  You can write it on any piece of paper.  please call us sooner if your blood sugar goes below 70, or if you have a lot of readings over 200. Please call us next week, to tell us how the blood sugar is doing.

## 2015-10-19 ENCOUNTER — Ambulatory Visit (AMBULATORY_SURGERY_CENTER): Payer: Self-pay | Admitting: *Deleted

## 2015-10-19 VITALS — Ht <= 58 in | Wt 229.4 lb

## 2015-10-19 DIAGNOSIS — Z1211 Encounter for screening for malignant neoplasm of colon: Secondary | ICD-10-CM

## 2015-10-19 MED ORDER — SUPREP BOWEL PREP KIT 17.5-3.13-1.6 GM/177ML PO SOLN: 1.0000 | Freq: Once | ORAL | Status: DC

## 2015-10-19 NOTE — Progress Notes (Signed)
Patient denies any allergies to egg or soy products. Patient denies complications with anesthesia/sedation.  Patient denies oxygen use at home and denies diet medications. Emmi instructions for colonoscopy explained but patient denied.  Pamphlet given.  

## 2015-11-02 ENCOUNTER — Ambulatory Visit (AMBULATORY_SURGERY_CENTER): Payer: Managed Care, Other (non HMO) | Admitting: Gastroenterology

## 2015-11-02 ENCOUNTER — Encounter: Payer: Self-pay | Admitting: Gastroenterology

## 2015-11-02 VITALS — BP 184/94 | HR 70 | Temp 97.1°F | Resp 14 | Ht <= 58 in | Wt 229.0 lb

## 2015-11-02 DIAGNOSIS — D120 Benign neoplasm of cecum: Secondary | ICD-10-CM | POA: Diagnosis not present

## 2015-11-02 DIAGNOSIS — Z1211 Encounter for screening for malignant neoplasm of colon: Secondary | ICD-10-CM | POA: Diagnosis not present

## 2015-11-02 LAB — GLUCOSE, CAPILLARY
GLUCOSE-CAPILLARY: 69 mg/dL (ref 65–99)
GLUCOSE-CAPILLARY: 103 mg/dL — AB (ref 65–99)
Glucose-Capillary: 60 mg/dL — ABNORMAL LOW (ref 65–99)

## 2015-11-02 MED ORDER — SODIUM CHLORIDE 0.9 % IV SOLN: 500.0000 mL | INTRAVENOUS | Status: DC

## 2015-11-02 NOTE — Patient Instructions (Signed)
YOU HAD AN ENDOSCOPIC PROCEDURE TODAY AT [facility]:   Refer to the procedure report that was given to you for any specific questions about what was found during the examination.  If the procedure report does not answer your questions, please call your gastroenterologist to clarify.  If you requested that your care partner not be given the details of your procedure findings, then the procedure report has been included in a sealed envelope for you to review at your convenience later.  YOU SHOULD EXPECT: Some feelings of bloating in the abdomen. Passage of more gas than usual.  Walking can help get rid of the air that was put into your GI tract during the procedure and reduce the bloating. If you had a lower endoscopy (such as a colonoscopy or flexible sigmoidoscopy) you may notice spotting of blood in your stool or on the toilet paper. If you underwent a bowel prep for your procedure, you may not have a normal bowel movement for a few days.  Please Note:  You might notice some irritation and congestion in your nose or some drainage.  This is from the oxygen used during your procedure.  There is no need for concern and it should clear up in a day or so.  SYMPTOMS TO REPORT IMMEDIATELY:   Following lower endoscopy (colonoscopy or flexible sigmoidoscopy):  Excessive amounts of blood in the stool  Significant tenderness or worsening of abdominal pains  Swelling of the abdomen that is new, acute  Fever of 100F or higher  Fo For urgent or emergent issues, a gastroenterologist can be reached at any hour by calling [phone].   DIET: Your first meal following the procedure should be a small meal and then it is ok to progress to your normal diet. Heavy or fried foods are harder to digest and may make you feel nauseous or bloated.  Likewise, meals heavy in dairy and vegetables can increase bloating.  Drink plenty of fluids but you should avoid alcoholic beverages for 24  hours.  ACTIVITY:  You should plan to take it easy for the rest of today and you should NOT DRIVE or use heavy machinery until tomorrow (because of the sedation medicines used during the test).    FOLLOW UP: Our staff will call the number listed on your records the next business day following your procedure to check on you and address any questions or concerns that you may have regarding the information given to you following your procedure. If we do not reach you, we will leave a message.  However, if you are feeling well and you are not experiencing any problems, there is no need to return our call.  We will assume that you have returned to your regular daily activities without incident.  If any biopsies were taken you will be contacted by phone or by letter within the next 1-3 weeks.  Please call us at [phone] if you have not heard about the biopsies in 3 weeks.    SIGNATURES/CONFIDENTIALITY: You and/or your care partner have signed paperwork which will be entered into your electronic medical record.  These signatures attest to the fact that that the information above on your After Visit Summary has been reviewed and is understood.  Full responsibility of the confidentiality of this discharge information lies with you and/or your care-partner.  Polyp, diverticulosis, and high fiber diet information given,

## 2015-11-02 NOTE — Op Note (Signed)
[facility] Patient Name: Dema Mckim Procedure Date: 11/02/2015 8:23 AM MRN: [id number] Endoscopist: Remo Lipps P. Havery Moros , MD Age: 55 Date of Birth: 1960/09/25 Gender: Female Procedure:                Colonoscopy Indications:              Screening for malignant neoplasm in the colon, This                            is the patient's first colonoscopy Medicines:                Monitored Anesthesia Care Procedure:                Pre-Anesthesia Assessment:                           - Prior to the procedure, a History and Physical                            was performed, and patient medications and                            allergies were reviewed. The patient's tolerance of                            previous anesthesia was also reviewed. The risks                            and benefits of the procedure and the sedation                            options and risks were discussed with the patient.                            All questions were answered, and informed consent                            was obtained. Prior Anticoagulants: The patient has                            taken no previous anticoagulant or antiplatelet                            agents. ASA Grade Assessment: III - A patient with                            severe systemic disease. After reviewing the risks                            and benefits, the patient was deemed in                            satisfactory condition to undergo the procedure.  After obtaining informed consent, the colonoscope                            was passed under direct vision. Throughout the                            procedure, the patient's blood pressure, pulse, and                            oxygen saturations were monitored continuously. The                            Model CF-HQ190L [id number]) scope was introduced                            through the anus and advanced to the the cecum,                            identified by appendiceal orifice and ileocecal                            valve. The colonoscopy was performed without                            difficulty. The patient tolerated the procedure                            well. The quality of the bowel preparation was                            adequate. The ileocecal valve, appendiceal orifice,                            and rectum were photographed. Scope In: 8:33:59 AM Scope Out: 8:53:20 AM Scope Withdrawal Time: 0 hours 16 minutes 22 seconds  Total Procedure Duration: 0 hours 19 minutes 21 seconds  Findings:                 The perianal and digital rectal examinations were                            normal.                           A 3 mm polyp was found in the ileocecal valve. The                            polyp was sessile. The polyp was removed with a                            cold biopsy forceps. Resection and retrieval were                            complete.  Multiple small-mouthed diverticula were found in                            the left colon and right colon.                           The exam was otherwise normal throughout the                            examined colon. Complications:            No immediate complications. Estimated blood loss:                            Minimal. Estimated Blood Loss:     Estimated blood loss was minimal. Impression:               - One 3 mm polyp at the ileocecal valve, removed                            with a cold biopsy forceps. Resected and retrieved.                           - Diverticulosis in the left colon and in the right                            colon. Recommendation:           - Patient has a contact number available for                            emergencies. The signs and symptoms of potential                            delayed complications were discussed with the                            patient. Return to normal  activities tomorrow.                            Written discharge instructions were provided to the                            patient.                           - Resume previous diet.                           - Continue present medications.                           - Await pathology results.                           - Repeat colonoscopy is recommended for  surveillance. The colonoscopy date will be                            determined after pathology results from today's                            exam become available for review. Remo Lipps P. Armbruster, MD 11/02/2015 8:56:23 AM This report has been signed electronically.

## 2015-11-02 NOTE — Progress Notes (Signed)
Transferred to recovery  vss  Report to [employer]

## 2015-11-02 NOTE — Progress Notes (Signed)
Called to room to assist during endoscopic procedure.  Patient ID and intended procedure confirmed with present staff. Received instructions for my participation in the procedure from the performing physician.  

## 2015-11-03 ENCOUNTER — Encounter: Payer: Self-pay | Admitting: Endocrinology

## 2015-11-03 ENCOUNTER — Ambulatory Visit (INDEPENDENT_AMBULATORY_CARE_PROVIDER_SITE_OTHER): Payer: Managed Care, Other (non HMO) | Admitting: Endocrinology

## 2015-11-03 ENCOUNTER — Telehealth: Payer: Self-pay

## 2015-11-03 VITALS — BP 147/94 | HR 74 | Temp 97.8°F | Ht 59.0 in | Wt 232.0 lb

## 2015-11-03 DIAGNOSIS — E119 Type 2 diabetes mellitus without complications: Secondary | ICD-10-CM

## 2015-11-03 LAB — POCT GLYCOSYLATED HEMOGLOBIN (HGB A1C): Hemoglobin A1C: 9.6

## 2015-11-03 MED ORDER — INSULIN ISOPHANE HUMAN 100 UNIT/ML KWIKPEN: 120.0000 [IU] | PEN_INJECTOR | SUBCUTANEOUS | Status: DC

## 2015-11-03 MED ORDER — ALBUTEROL SULFATE HFA 108 (90 BASE) MCG/ACT IN AERS: 2.0000 | INHALATION_SPRAY | Freq: Four times a day (QID) | RESPIRATORY_TRACT | Status: DC | PRN

## 2015-11-03 NOTE — Telephone Encounter (Signed)
  Follow up Call-  Call back number 11/02/2015  Post procedure Call Back phone  # [phone]  Permission to leave phone message Yes    Patient was called for follow up after her procedure on 11/02/2015. No answer at the number given for follow up phone call. A message was left on the answering machine.

## 2015-11-03 NOTE — Progress Notes (Signed)
Subjective:    Patient ID: Kaylee Lynn, female    DOB: 1960-06-30, 55 y.o.   MRN: [id number]  HPI Pt returns for f/u of diabetes mellitus: DM type: Insulin-requiring type 2 Dx'ed: [id number] Complications: none Therapy: insulin since 2015.  GDM: never. DKA: never Severe hypoglycemia: never. Pancreatitis: never. Other: she declined bariatric surgery; she chose a simple insulin schedule; ins did not pay for lantus.  Interval history: no recent steroids.  no cbg record, but states cbg's vary from 53-200's.  It is in general higher as the day goes on.   Past Medical History  Diagnosis Date  . Hypertension   . Allergy   . Type I (juvenile type) diabetes mellitus without mention of complication, not stated as uncontrolled     per patient type 2   . Anemia     history  . Bronchitis     Hx - uses inhaler prn    Past Surgical History  Procedure Laterality Date  . Left eye surgery      at age 66yrs, weak muscle  . Missed abortion      elective     Social History   Social History  . Marital Status: Single    Spouse Name: N/A  . Number of Children: N/A  . Years of Education: N/A   Occupational History  . Not on file.   Social History Main Topics  . Smoking status: Never Smoker   . Smokeless tobacco: Never Used  . Alcohol Use: 0.6 oz/week    1 Glasses of wine per week     Comment: occasional  . Drug Use: No  . Sexual Activity: Yes    Birth Control/ Protection: Post-menopausal   Other Topics Concern  . Not on file   Social History Narrative    Current Outpatient Prescriptions on File Prior to Visit  Medication Sig Dispense Refill  . B-D ULTRAFINE III SHORT PEN 31G X 8 MM MISC USE AS DIRECTED ONCE DAILY 100 each 2  . Fluticasone-Salmeterol (ADVAIR DISKUS) 100-50 MCG/DOSE AEPB Inhale 1 puff into the lungs 2 (two) times daily. 1 each 0  . glucose blood (ONETOUCH VERIO) test strip 1 each by Other route 2 (two) times daily. And lancets 2/day 250.01 100 each 12  .  losartan-hydrochlorothiazide (HYZAAR) 100-25 MG per tablet TAKE 1 TABLET BY MOUTH DAILY. 30 tablet 9  . Omega-3 Fatty Acids (OMEGA 3 PO) Take 1 tablet by mouth.    Glory Rosebush DELICA LANCETS 99991111 MISC USE AS DIRECTED TWICE A DAY 100 each 8  . ONETOUCH VERIO test strip USE TWICE DAILY. 100 each 11  . triamcinolone cream (KENALOG) 0.1 % APPLY 1 APPLICATION 3 (THREE) TIMES DAILY. AS NEEDED FOR ITCHING OR RASH 45 g 4   No current facility-administered medications on file prior to visit.    No Known Allergies  Family History  Problem Relation Age of Onset  . Colon cancer Neg Hx   . Esophageal cancer Neg Hx   . Rectal cancer Neg Hx   . Stomach cancer Neg Hx     BP 147/94 mmHg  Pulse 74  Temp(Src) 97.8 F (36.6 C)  Ht 4\' 11"  (1.499 m)  Wt 232 lb (105.235 kg)  BMI 46.83 kg/m2  SpO2 98%  LMP 07/16/2012  Review of Systems Denies LOC.      Objective:   Physical Exam VITAL SIGNS:  See vs page GENERAL: no distress SKIN:  Insulin injection sites at the anterior abdomen are normal.  A1c=9.6%    Assessment & Plan:  Based on the pattern of her cbg's, she needs a faster-acting qd insulin.    Patient is advised the following: Patient Instructions  Please come back for a regular physical appointment in 2 months.  i have sent a prescription to your pharmacy, to change basaglar to "NPH."  i have sent a prescription to your pharmacy.  check your blood sugar twice a day.  vary the time of day when you check, between before the 3 meals, and at bedtime.  also check if you have symptoms of your blood sugar being too high or too low.  please keep a record of the readings and bring it to your next appointment here (or you can bring the meter itself).  You can write it on any piece of paper.  please call us sooner if your blood sugar goes below 70, or if you have a lot of readings over 200.

## 2015-11-03 NOTE — Patient Instructions (Addendum)
Please come back for a regular physical appointment in 2 months.  i have sent a prescription to your pharmacy, to change basaglar to "NPH."  i have sent a prescription to your pharmacy.  check your blood sugar twice a day.  vary the time of day when you check, between before the 3 meals, and at bedtime.  also check if you have symptoms of your blood sugar being too high or too low.  please keep a record of the readings and bring it to your next appointment here (or you can bring the meter itself).  You can write it on any piece of paper.  please call us sooner if your blood sugar goes below 70, or if you have a lot of readings over 200.

## 2015-11-04 ENCOUNTER — Other Ambulatory Visit: Payer: Self-pay | Admitting: Endocrinology

## 2015-11-04 ENCOUNTER — Telehealth: Payer: Self-pay | Admitting: Endocrinology

## 2015-11-04 MED ORDER — INSULIN DETEMIR 100 UNIT/ML FLEXPEN: 140.0000 [IU] | PEN_INJECTOR | SUBCUTANEOUS | Status: DC

## 2015-11-04 NOTE — Telephone Encounter (Signed)
I contacted the pt and advised of note below via voicemail. Requested a call back if the pt would like to discuss.  

## 2015-11-04 NOTE — Telephone Encounter (Signed)
please call patient: Insurance wants you to take levemir instead. i have sent a prescription to your pharmacy It is not a unit-for-unit conversion, si it will be 140 units each morning.

## 2015-11-08 ENCOUNTER — Encounter: Payer: Self-pay | Admitting: Gastroenterology

## 2015-11-08 ENCOUNTER — Telehealth: Payer: Self-pay | Admitting: Endocrinology

## 2015-11-08 NOTE — Telephone Encounter (Signed)
Kaylee Lynn from [facility] ask is patient still requiring a PA for the insulin Humulin N, pleas advise [phone]

## 2015-11-08 NOTE — Telephone Encounter (Signed)
I contacted the pharmacy and advised we have cancelled the Humulin N rx and sent Levemir as the alternative.

## 2016-01-03 ENCOUNTER — Ambulatory Visit: Payer: Managed Care, Other (non HMO) | Admitting: Endocrinology

## 2016-02-09 ENCOUNTER — Encounter: Payer: Self-pay | Admitting: Endocrinology

## 2016-02-09 ENCOUNTER — Ambulatory Visit (INDEPENDENT_AMBULATORY_CARE_PROVIDER_SITE_OTHER): Payer: Managed Care, Other (non HMO) | Admitting: Endocrinology

## 2016-02-09 VITALS — BP 132/87 | HR 86 | Ht 59.0 in | Wt 243.0 lb

## 2016-02-09 DIAGNOSIS — E119 Type 2 diabetes mellitus without complications: Secondary | ICD-10-CM | POA: Diagnosis not present

## 2016-02-09 LAB — POCT GLYCOSYLATED HEMOGLOBIN (HGB A1C): HEMOGLOBIN A1C: 10.3

## 2016-02-09 MED ORDER — TRIAMCINOLONE ACETONIDE 0.1 % EX CREA: TOPICAL_CREAM | CUTANEOUS | 4 refills | Status: DC

## 2016-02-09 MED ORDER — BASAGLAR KWIKPEN 100 UNIT/ML ~~LOC~~ SOPN: 130.0000 [IU] | PEN_INJECTOR | SUBCUTANEOUS | 11 refills | Status: DC

## 2016-02-09 MED ORDER — ALBUTEROL SULFATE HFA 108 (90 BASE) MCG/ACT IN AERS: 2.0000 | INHALATION_SPRAY | Freq: Four times a day (QID) | RESPIRATORY_TRACT | 3 refills | Status: DC | PRN

## 2016-02-09 NOTE — Patient Instructions (Addendum)
Please come back for a regular physical appointment in 2 months.   Please increase the basaglar to 130 units each morning.   check your blood sugar twice a day.  vary the time of day when you check, between before the 3 meals, and at bedtime.  also check if you have symptoms of your blood sugar being too high or too low.  please keep a record of the readings and bring it to your next appointment here (or you can bring the meter itself).  You can write it on any piece of paper.  please call us sooner if your blood sugar goes below 70, or if you have a lot of readings over 200.

## 2016-02-09 NOTE — Progress Notes (Signed)
Subjective:    Patient ID: Kaylee Lynn, female    DOB: May 13, 1961, 55 y.o.   MRN: [id number]  HPI Pt returns for f/u of diabetes mellitus: DM type: Insulin-requiring type 2 Dx'ed: 2008.  Complications: none.  Therapy: insulin since 2015.  GDM: never. DKA: never.   Severe hypoglycemia: never.   Pancreatitis: never.   Other: she declined bariatric surgery; she chose a QD insulin schedule; on basaglar, she had AM hypoglycemia and PM hyperglycemia.   Interval history: no recent steroids.  no cbg record, but states cbg's vary from 150-300's.  It is in general higher as the day goes on.  She is still on basaglar, 100 units qam.   Past Medical History:  Diagnosis Date  . Allergy   . Anemia    history  . Bronchitis    Hx - uses inhaler prn  . Hypertension   . Type I (juvenile type) diabetes mellitus without mention of complication, not stated as uncontrolled    per patient type 2     Past Surgical History:  Procedure Laterality Date  . left eye surgery     at age 73yrs, weak muscle  . missed abortion     elective     Social History   Social History  . Marital status: Single    Spouse name: N/A  . Number of children: N/A  . Years of education: N/A   Occupational History  . Not on file.   Social History Main Topics  . Smoking status: Never Smoker  . Smokeless tobacco: Never Used  . Alcohol use 0.6 oz/week    1 Glasses of wine per week     Comment: occasional  . Drug use: No  . Sexual activity: Yes    Birth control/ protection: Post-menopausal   Other Topics Concern  . Not on file   Social History Narrative  . No narrative on file    Current Outpatient Prescriptions on File Prior to Visit  Medication Sig Dispense Refill  . B-D ULTRAFINE III SHORT PEN 31G X 8 MM MISC USE AS DIRECTED ONCE DAILY 100 each 2  . Fluticasone-Salmeterol (ADVAIR DISKUS) 100-50 MCG/DOSE AEPB Inhale 1 puff into the lungs 2 (two) times daily. 1 each 0  . glucose blood (ONETOUCH  VERIO) test strip 1 each by Other route 2 (two) times daily. And lancets 2/day 250.01 100 each 12  . losartan-hydrochlorothiazide (HYZAAR) 100-25 MG per tablet TAKE 1 TABLET BY MOUTH DAILY. 30 tablet 9  . Omega-3 Fatty Acids (OMEGA 3 PO) Take 1 tablet by mouth.    Glory Rosebush DELICA LANCETS 99991111 MISC USE AS DIRECTED TWICE A DAY 100 each 8  . ONETOUCH VERIO test strip USE TWICE DAILY. 100 each 11   No current facility-administered medications on file prior to visit.     No Known Allergies  Family History  Problem Relation Age of Onset  . Colon cancer Neg Hx   . Esophageal cancer Neg Hx   . Rectal cancer Neg Hx   . Stomach cancer Neg Hx     BP 132/87   Pulse 86   Ht 4\' 11"  (1.499 m)   Wt 243 lb (110.2 kg)   LMP 07/16/2012   SpO2 95%   BMI 49.08 kg/m   Review of Systems She denies hypoglycemia.      Objective:   Physical Exam VITAL SIGNS:  See vs page GENERAL: no distress Pulses: dorsalis pedis intact bilat.   MSK: no deformity of  the feet CV: no leg edema Skin:  no ulcer on the feet.  normal color and temp on the feet.   Neuro: sensation is intact to touch on the feet.    Lab Results  Component Value Date   HGBA1C 10.3 02/09/2016      Assessment & Plan:  Insulin-requiring type 2: worse.   Noncompliance with cbg recording and insulin: I'll work around this as best I can.  As she says her cbg is high all the time, we don't yet know if she needs a faster-acting qd insulin.

## 2016-02-17 ENCOUNTER — Other Ambulatory Visit: Payer: Self-pay | Admitting: Endocrinology

## 2016-04-10 ENCOUNTER — Ambulatory Visit: Payer: Managed Care, Other (non HMO) | Admitting: Endocrinology

## 2016-04-10 DIAGNOSIS — Z0289 Encounter for other administrative examinations: Secondary | ICD-10-CM

## 2016-07-20 ENCOUNTER — Encounter: Payer: Self-pay | Admitting: Endocrinology

## 2016-07-20 ENCOUNTER — Ambulatory Visit (INDEPENDENT_AMBULATORY_CARE_PROVIDER_SITE_OTHER): Payer: Managed Care, Other (non HMO) | Admitting: Endocrinology

## 2016-07-20 VITALS — BP 132/82 | HR 81 | Ht 59.0 in | Wt 228.0 lb

## 2016-07-20 DIAGNOSIS — Z0000 Encounter for general adult medical examination without abnormal findings: Secondary | ICD-10-CM

## 2016-07-20 DIAGNOSIS — E119 Type 2 diabetes mellitus without complications: Secondary | ICD-10-CM

## 2016-07-20 DIAGNOSIS — Z23 Encounter for immunization: Secondary | ICD-10-CM | POA: Diagnosis not present

## 2016-07-20 LAB — LIPID PANEL
CHOL/HDL RATIO: 3
CHOLESTEROL: 158 mg/dL (ref 0–200)
HDL: 49.8 mg/dL (ref >39.00)
LDL Cholesterol: 96 mg/dL (ref 0–99)
NonHDL: 108.44
TRIGLYCERIDES: 64 mg/dL (ref 0.0–149.0)
VLDL: 12.8 mg/dL (ref 0.0–40.0)

## 2016-07-20 LAB — BASIC METABOLIC PANEL
BUN: 14 mg/dL (ref 6–23)
CO2: 29 meq/L (ref 19–32)
CREATININE: 0.68 mg/dL (ref 0.40–1.20)
Calcium: 10.1 mg/dL (ref 8.4–10.5)
Chloride: 97 mEq/L (ref 96–112)
GFR: 114.34 mL/min (ref >60.00)
Glucose, Bld: 397 mg/dL — ABNORMAL HIGH (ref 70–99)
Potassium: 4.2 mEq/L (ref 3.5–5.1)
SODIUM: 134 meq/L — AB (ref 135–145)

## 2016-07-20 LAB — CBC WITH DIFFERENTIAL/PLATELET
BASOS ABS: 0 10*3/uL (ref 0.0–0.1)
Basophils Relative: 0.2 % (ref 0.0–3.0)
Eosinophils Absolute: 0.2 10*3/uL (ref 0.0–0.7)
Eosinophils Relative: 1.9 % (ref 0.0–5.0)
HEMATOCRIT: 44 % (ref 36.0–46.0)
HEMOGLOBIN: 14.4 g/dL (ref 12.0–15.0)
LYMPHS PCT: 32.9 % (ref 12.0–46.0)
Lymphs Abs: 2.6 10*3/uL (ref 0.7–4.0)
MCHC: 32.6 g/dL (ref 30.0–36.0)
MCV: 78.1 fl (ref 78.0–100.0)
MONOS PCT: 3.9 % (ref 3.0–12.0)
Monocytes Absolute: 0.3 10*3/uL (ref 0.1–1.0)
NEUTROS ABS: 4.9 10*3/uL (ref 1.4–7.7)
Neutrophils Relative %: 61.1 % (ref 43.0–77.0)
PLATELETS: 185 10*3/uL (ref 150.0–400.0)
RBC: 5.63 Mil/uL — AB (ref 3.87–5.11)
RDW: 13.3 % (ref 11.5–15.5)
WBC: 8 10*3/uL (ref 4.0–10.5)

## 2016-07-20 LAB — URINALYSIS, ROUTINE W REFLEX MICROSCOPIC
BILIRUBIN URINE: NEGATIVE
HGB URINE DIPSTICK: NEGATIVE
KETONES UR: NEGATIVE
LEUKOCYTES UA: NEGATIVE
NITRITE: NEGATIVE
PH: 5.5 (ref 5.0–8.0)
Specific Gravity, Urine: 1.015 (ref 1.000–1.030)
Total Protein, Urine: NEGATIVE
UROBILINOGEN UA: 0.2 (ref 0.0–1.0)
Urine Glucose: >=1000 — AB

## 2016-07-20 LAB — HEPATIC FUNCTION PANEL
ALT: 26 U/L (ref 0–35)
AST: 16 U/L (ref 0–37)
Albumin: 4.1 g/dL (ref 3.5–5.2)
Alkaline Phosphatase: 66 U/L (ref 39–117)
BILIRUBIN DIRECT: 0 mg/dL (ref 0.0–0.3)
TOTAL PROTEIN: 7.8 g/dL (ref 6.0–8.3)
Total Bilirubin: 0.4 mg/dL (ref 0.2–1.2)

## 2016-07-20 LAB — MICROALBUMIN / CREATININE URINE RATIO
CREATININE, U: 55.9 mg/dL
MICROALB/CREAT RATIO: 8 mg/g (ref 0.0–30.0)
Microalb, Ur: 4.5 mg/dL — ABNORMAL HIGH (ref 0.0–1.9)

## 2016-07-20 LAB — POCT GLYCOSYLATED HEMOGLOBIN (HGB A1C)

## 2016-07-20 LAB — TSH: TSH: 1.48 u[IU]/mL (ref 0.35–4.50)

## 2016-07-20 MED ORDER — LOSARTAN POTASSIUM-HCTZ 100-25 MG PO TABS: 1.0000 | ORAL_TABLET | Freq: Every day | ORAL | 3 refills | Status: DC

## 2016-07-20 MED ORDER — TRIAMCINOLONE ACETONIDE 0.1 % EX CREA: TOPICAL_CREAM | CUTANEOUS | 4 refills | Status: DC

## 2016-07-20 MED ORDER — BASAGLAR KWIKPEN 100 UNIT/ML ~~LOC~~ SOPN: 130.0000 [IU] | PEN_INJECTOR | SUBCUTANEOUS | 11 refills | Status: DC

## 2016-07-20 MED ORDER — ALBUTEROL SULFATE HFA 108 (90 BASE) MCG/ACT IN AERS: 2.0000 | INHALATION_SPRAY | Freq: Four times a day (QID) | RESPIRATORY_TRACT | 3 refills | Status: DC | PRN

## 2016-07-20 MED ORDER — GLUCOSE BLOOD VI STRP: ORAL_STRIP | 11 refills | Status: DC

## 2016-07-20 MED ORDER — ONETOUCH DELICA LANCETS 33G MISC: 8 refills | Status: DC

## 2016-07-20 NOTE — Progress Notes (Signed)
Subjective:    Patient ID: Kaylee Lynn, female    DOB: 05-23-1961, 56 y.o.   MRN: [id number]  HPI Pt is here for regular wellness examination, and is feeling pretty well in general, and says chronic med probs are stable, except as noted below Past Medical History:  Diagnosis Date  . Allergy   . Anemia    history  . Bronchitis    Hx - uses inhaler prn  . Hypertension   . Type I (juvenile type) diabetes mellitus without mention of complication, not stated as uncontrolled    per patient type 2     Past Surgical History:  Procedure Laterality Date  . left eye surgery     at age 15yrs, weak muscle  . missed abortion     elective     Social History   Social History  . Marital status: Single    Spouse name: N/A  . Number of children: N/A  . Years of education: N/A   Occupational History  . Not on file.   Social History Main Topics  . Smoking status: Never Smoker  . Smokeless tobacco: Never Used  . Alcohol use 0.6 oz/week    1 Glasses of wine per week     Comment: occasional  . Drug use: No  . Sexual activity: Yes    Birth control/ protection: Post-menopausal   Other Topics Concern  . Not on file   Social History Narrative  . No narrative on file    Current Outpatient Prescriptions on File Prior to Visit  Medication Sig Dispense Refill  . B-D ULTRAFINE III SHORT PEN 31G X 8 MM MISC USE AS DIRECTED ONCE DAILY 100 each 2  . Fluticasone-Salmeterol (ADVAIR DISKUS) 100-50 MCG/DOSE AEPB Inhale 1 puff into the lungs 2 (two) times daily. 1 each 0  . glucose blood (ONETOUCH VERIO) test strip 1 each by Other route 2 (two) times daily. And lancets 2/day 250.01 100 each 12  . Omega-3 Fatty Acids (OMEGA 3 PO) Take 1 tablet by mouth.     No current facility-administered medications on file prior to visit.     No Known Allergies  Family History  Problem Relation Age of Onset  . Colon cancer Neg Hx   . Esophageal cancer Neg Hx   . Rectal cancer Neg Hx   . Stomach  cancer Neg Hx     BP 132/82   Pulse 81   Ht 4\' 11"  (1.499 m)   Wt 228 lb (103.4 kg)   LMP 07/16/2012   SpO2 94%   BMI 46.05 kg/m     Review of Systems Review of Systems  Constitutional: Negative for fever.  HENT: Negative for hearing loss.   Eyes: Negative for visual loss. Pulm: denies sob Cardiovascular: Negative for chest pain.  Gastrointestinal: Negative for blood in stool.  Endocrine: Negative for cold intolerance.  Genitourinary: Negative for hematuria.  Musculoskeletal: Negative for back pain.  Skin: Negative for wound.  Allergic/Immunologic: Positive for environmental allergies.  Neurological: Negative for syncope and numbness.  Hematological: denies easy bruising Psychiatric/Behavioral: Negative for dysphoric mood.     Objective:   Physical Exam VS: see vs page GEN: no distress HEAD: head: no deformity eyes: no periorbital swelling, no proptosis.  external nose and ears are normal mouth: no lesion seen NECK: supple, thyroid is not enlarged CHEST WALL: no deformity LUNGS:  Clear to auscultation BREASTS: sees gyn CV: reg rate and rhythm, no murmur ABD: abdomen is soft, nontender.  no hepatosplenomegaly.  not distended.  no hernia GENITALIA/RECTAL: sees gyn MUSCULOSKELETAL: muscle bulk and strength are grossly normal.  no obvious joint swelling.  gait is normal and steady PULSES: no carotid bruit NEURO:  cn 2-12 grossly intact.   readily moves all 4's.   SKIN:  Normal texture and temperature.  No rash or suspicious lesion is visible.   NODES:  None palpable at the neck PSYCH: alert, well-oriented.  Does not appear anxious nor depressed.  I personally reviewed electrocardiogram tracing (today): Indication:  Impression: NSR.  No MI.  No hypertrophy.  Nonspecific T-abnormality. Compared to: no change       Assessment & Plan:  Wellness visit today, with problems stable, except as noted.  SEPARATE EVALUATION FOLLOWS--EACH PROBLEM HERE IS NEW, NOT  RESPONDING TO TREATMENT, OR POSES SIGNIFICANT RISK TO THE PATIENT'S HEALTH: HISTORY OF THE PRESENT ILLNESS: Pt returns for f/u of diabetes mellitus: DM type: Insulin-requiring type 2 Dx'ed: 2008.  Complications: none.  Therapy: insulin since 2015.  GDM: never. DKA: never.   Severe hypoglycemia: never.   Pancreatitis: never.   Other: she declined bariatric surgery; she chose a QD insulin schedule; on basaglar, she had AM hypoglycemia and PM hyperglycemia.   Interval history: she has not recently taken insulin.  pt states she feels well in general.  PAST MEDICAL HISTORY Past Medical History:  Diagnosis Date  . Allergy   . Anemia    history  . Bronchitis    Hx - uses inhaler prn  . Hypertension   . Type I (juvenile type) diabetes mellitus without mention of complication, not stated as uncontrolled    per patient type 2     Past Surgical History:  Procedure Laterality Date  . left eye surgery     at age 73yrs, weak muscle  . missed abortion     elective     Social History   Social History  . Marital status: Single    Spouse name: N/A  . Number of children: N/A  . Years of education: N/A   Occupational History  . Not on file.   Social History Main Topics  . Smoking status: Never Smoker  . Smokeless tobacco: Never Used  . Alcohol use 0.6 oz/week    1 Glasses of wine per week     Comment: occasional  . Drug use: No  . Sexual activity: Yes    Birth control/ protection: Post-menopausal   Other Topics Concern  . Not on file   Social History Narrative  . No narrative on file    Current Outpatient Prescriptions on File Prior to Visit  Medication Sig Dispense Refill  . B-D ULTRAFINE III SHORT PEN 31G X 8 MM MISC USE AS DIRECTED ONCE DAILY 100 each 2  . Fluticasone-Salmeterol (ADVAIR DISKUS) 100-50 MCG/DOSE AEPB Inhale 1 puff into the lungs 2 (two) times daily. 1 each 0  . glucose blood (ONETOUCH VERIO) test strip 1 each by Other route 2 (two) times daily. And  lancets 2/day 250.01 100 each 12  . Omega-3 Fatty Acids (OMEGA 3 PO) Take 1 tablet by mouth.     No current facility-administered medications on file prior to visit.     No Known Allergies  Family History  Problem Relation Age of Onset  . Colon cancer Neg Hx   . Esophageal cancer Neg Hx   . Rectal cancer Neg Hx   . Stomach cancer Neg Hx     BP 132/82   Pulse 81  Ht 4\' 11"  (1.499 m)   Wt 228 lb (103.4 kg)   LMP 07/16/2012   SpO2 94%   BMI 46.05 kg/m   REVIEW OF SYSTEMS: PHYSICAL EXAMINATION: VITAL SIGNS:  See vs page GENERAL: no distress Pulses: dorsalis pedis intact bilat.   MSK: no deformity of the feet CV: no leg edema.  Skin:  no ulcer on the feet.  normal color and temp on the feet. Neuro: sensation is intact to touch on the feet.   LAB/XRAY RESULTS: Lab Results  Component Value Date   HGBA1C >15 07/20/2016  IMPRESSION: Type 2 DM: much worse.  HTN: therapy limited by noncompliance.  PLAN:  Patient is advised the following:  Patient Instructions  I have sent a prescription to your pharmacy, to resume your meds, including insulin.   blood tests are requested for you today.  We'll let you know about the results.  Please consider these measures for your health:  minimize alcohol.  Do not use tobacco products.  Have a colonoscopy at least every 10 years from age 95.  Women should have an annual mammogram from age 36.  Keep firearms safely stored.  Always use seat belts.  have working smoke alarms in your home.  See an eye doctor and dentist regularly.  Never drive under the influence of alcohol or drugs (including prescription drugs).   check your blood sugar twice a day.  vary the time of day when you check, between before the 3 meals, and at bedtime.  also check if you have symptoms of your blood sugar being too high or too low.  please keep a record of the readings and bring it to your next appointment here (or you can bring the meter itself).  You can write it on  any piece of paper.  please call us sooner if your blood sugar goes below 70, or if you have a lot of readings over 200.  Please come back for a follow-up appointment in 1 month.

## 2016-07-20 NOTE — Patient Instructions (Addendum)
I have sent a prescription to your pharmacy, to resume your meds, including insulin.   blood tests are requested for you today.  We'll let you know about the results.  Please consider these measures for your health:  minimize alcohol.  Do not use tobacco products.  Have a colonoscopy at least every 10 years from age 58.  Women should have an annual mammogram from age 49.  Keep firearms safely stored.  Always use seat belts.  have working smoke alarms in your home.  See an eye doctor and dentist regularly.  Never drive under the influence of alcohol or drugs (including prescription drugs).   check your blood sugar twice a day.  vary the time of day when you check, between before the 3 meals, and at bedtime.  also check if you have symptoms of your blood sugar being too high or too low.  please keep a record of the readings and bring it to your next appointment here (or you can bring the meter itself).  You can write it on any piece of paper.  please call us sooner if your blood sugar goes below 70, or if you have a lot of readings over 200.  Please come back for a follow-up appointment in 1 month.

## 2016-07-20 NOTE — Progress Notes (Signed)
           Review of Systems  Constitutional: Negative for fever.  HENT: Negative for hearing loss.   Eyes: Negative for itching.  Cardiovascular: Negative for chest pain.  Gastrointestinal: Negative for blood in stool.  Endocrine: Negative for cold intolerance.  Genitourinary: Negative for hematuria.  Musculoskeletal: Negative for back pain.  Skin: Negative for wound.  Allergic/Immunologic: Positive for environmental allergies.  Neurological: Negative for syncope and numbness.  Hematological: Bruises/bleeds easily.  Psychiatric/Behavioral: Negative for dysphoric mood.

## 2016-07-21 LAB — HEPATITIS C ANTIBODY: HCV AB: NEGATIVE

## 2016-07-21 LAB — HIV ANTIBODY (ROUTINE TESTING W REFLEX): HIV: NONREACTIVE

## 2016-08-22 ENCOUNTER — Ambulatory Visit: Payer: Managed Care, Other (non HMO) | Admitting: Endocrinology

## 2016-09-18 ENCOUNTER — Encounter: Payer: Self-pay | Admitting: Endocrinology

## 2016-09-18 ENCOUNTER — Ambulatory Visit (INDEPENDENT_AMBULATORY_CARE_PROVIDER_SITE_OTHER): Payer: Managed Care, Other (non HMO) | Admitting: Endocrinology

## 2016-09-18 VITALS — BP 134/84 | HR 85 | Ht 59.0 in | Wt 235.0 lb

## 2016-09-18 DIAGNOSIS — E119 Type 2 diabetes mellitus without complications: Secondary | ICD-10-CM

## 2016-09-18 LAB — POCT GLYCOSYLATED HEMOGLOBIN (HGB A1C): HEMOGLOBIN A1C: 12.9

## 2016-09-18 MED ORDER — BASAGLAR KWIKPEN 100 UNIT/ML ~~LOC~~ SOPN: 130.0000 [IU] | PEN_INJECTOR | SUBCUTANEOUS | 11 refills | Status: DC

## 2016-09-18 NOTE — Progress Notes (Signed)
Subjective:    Patient ID: Kaylee Lynn, female    DOB: 1960/09/13, 56 y.o.   MRN: [id number]  HPI Pt returns for f/u of diabetes mellitus: DM type: Insulin-requiring type 2 Dx'ed: 2008.   Complications: none.  Therapy: insulin since 2015.  GDM: never.  DKA: never.   Severe hypoglycemia: never.   Pancreatitis: never.   Other: she declined bariatric surgery; she declines multiple daily injections.  Interval history: She has been back on insulin x 2 weeks.  Since then, she says cbg's are in the 200's-300's.  pt states she feels well in general.  She takes 100 units qam Past Medical History:  Diagnosis Date  . Allergy   . Anemia    history  . Bronchitis    Hx - uses inhaler prn  . Hypertension   . Type I (juvenile type) diabetes mellitus without mention of complication, not stated as uncontrolled    per patient type 2     Past Surgical History:  Procedure Laterality Date  . left eye surgery     at age 54yrs, weak muscle  . missed abortion     elective     Social History   Social History  . Marital status: Single    Spouse name: N/A  . Number of children: N/A  . Years of education: N/A   Occupational History  . Not on file.   Social History Main Topics  . Smoking status: Never Smoker  . Smokeless tobacco: Never Used  . Alcohol use 0.6 oz/week    1 Glasses of wine per week     Comment: occasional  . Drug use: No  . Sexual activity: Yes    Birth control/ protection: Post-menopausal   Other Topics Concern  . Not on file   Social History Narrative  . No narrative on file    Current Outpatient Prescriptions on File Prior to Visit  Medication Sig Dispense Refill  . albuterol (PROVENTIL HFA;VENTOLIN HFA) 108 (90 Base) MCG/ACT inhaler Inhale 2 puffs into the lungs every 6 (six) hours as needed for wheezing or shortness of breath. 1 Inhaler 3  . B-D ULTRAFINE III SHORT PEN 31G X 8 MM MISC USE AS DIRECTED ONCE DAILY 100 each 2  . Fluticasone-Salmeterol  (ADVAIR DISKUS) 100-50 MCG/DOSE AEPB Inhale 1 puff into the lungs 2 (two) times daily. 1 each 0  . glucose blood (ONETOUCH VERIO) test strip 1 each by Other route 2 (two) times daily. And lancets 2/day 250.01 100 each 12  . losartan-hydrochlorothiazide (HYZAAR) 100-25 MG tablet Take 1 tablet by mouth daily. 90 tablet 3  . Omega-3 Fatty Acids (OMEGA 3 PO) Take 1 tablet by mouth.    Glory Rosebush DELICA LANCETS 74B MISC USE AS DIRECTED TWICE A DAY 100 each 8  . triamcinolone cream (KENALOG) 0.1 % APPLY 1 APPLICATION 3 (THREE) TIMES DAILY AS NEEDED FOR ITCHING OR RASH 45 g 4   No current facility-administered medications on file prior to visit.     No Known Allergies  Family History  Problem Relation Age of Onset  . Colon cancer Neg Hx   . Esophageal cancer Neg Hx   . Rectal cancer Neg Hx   . Stomach cancer Neg Hx     BP 134/84   Pulse 85   Ht 4\' 11"  (1.499 m)   Wt 235 lb (106.6 kg)   LMP 07/16/2012   SpO2 97%   BMI 47.46 kg/m    Review of Systems She  denies hypoglycemia.     Objective:   Physical Exam VITAL SIGNS:  See vs page GENERAL: no distress Pulses: dorsalis pedis intact bilat.   MSK: no deformity of the feet CV: no leg edema Skin:  no ulcer on the feet.  normal color and temp on the feet. Neuro: sensation is intact to touch on the feet.      Assessment & Plan:  Insulin-requiring type 2 DM: she needs increased rx.  Noncompliance with insulin dosing.  Patient is advised the following: Patient Instructions  check your blood sugar twice a day.  vary the time of day when you check, between before the 3 meals, and at bedtime.  also check if you have symptoms of your blood sugar being too high or too low.  please keep a record of the readings and bring it to your next appointment here (or you can bring the meter itself).  You can write it on any piece of paper.  please call us sooner if your blood sugar goes below 70, or if you have a lot of readings over 200.   Please  increase the insulin to 130 units qam.  Please come back for a follow-up appointment in 2 months.

## 2016-09-18 NOTE — Patient Instructions (Addendum)
check your blood sugar twice a day.  vary the time of day when you check, between before the 3 meals, and at bedtime.  also check if you have symptoms of your blood sugar being too high or too low.  please keep a record of the readings and bring it to your next appointment here (or you can bring the meter itself).  You can write it on any piece of paper.  please call us sooner if your blood sugar goes below 70, or if you have a lot of readings over 200.   Please increase the insulin to 130 units qam.  Please come back for a follow-up appointment in 2 months.

## 2016-10-09 LAB — HM DIABETES EYE EXAM

## 2016-11-20 ENCOUNTER — Ambulatory Visit: Payer: Managed Care, Other (non HMO) | Admitting: Endocrinology

## 2016-11-21 ENCOUNTER — Telehealth: Payer: Self-pay | Admitting: Endocrinology

## 2016-11-21 NOTE — Telephone Encounter (Signed)
Patient no showed today's appt. Please advise on how to follow up. °A. No follow up necessary. °B. Follow up urgent. Contact patient immediately. °C. Follow up necessary. Contact patient and schedule visit in ___ days. °D. Follow up advised. Contact patient and schedule visit in ____weeks. ° °

## 2016-11-21 NOTE — Telephone Encounter (Signed)
Please come back for a follow-up appointment in 3 months 

## 2016-12-18 ENCOUNTER — Encounter: Payer: Self-pay | Admitting: Endocrinology

## 2016-12-18 ENCOUNTER — Ambulatory Visit (INDEPENDENT_AMBULATORY_CARE_PROVIDER_SITE_OTHER): Payer: Managed Care, Other (non HMO) | Admitting: Endocrinology

## 2016-12-18 VITALS — BP 140/90 | HR 80 | Wt 244.0 lb

## 2016-12-18 DIAGNOSIS — E119 Type 2 diabetes mellitus without complications: Secondary | ICD-10-CM

## 2016-12-18 LAB — POCT GLYCOSYLATED HEMOGLOBIN (HGB A1C): Hemoglobin A1C: 10.3

## 2016-12-18 MED ORDER — INSULIN ISOPHANE HUMAN 100 UNIT/ML KWIKPEN: 120.0000 [IU] | PEN_INJECTOR | SUBCUTANEOUS | 11 refills | Status: DC

## 2016-12-18 NOTE — Progress Notes (Signed)
Subjective:    Patient ID: Kaylee Lynn, female    DOB: 03-08-1961, 56 y.o.   MRN: [id number]  HPI Pt returns for f/u of diabetes mellitus: DM type: Insulin-requiring type 2 Dx'ed: 2008.   Complications: none.  Therapy: insulin since 2015.  GDM: never.  DKA: never.   Severe hypoglycemia: never.   Pancreatitis: never.   Other: she declined bariatric surgery; she declines multiple daily injections.  Interval history: She never misses the insulin.  no cbg record, but states cbg's vary from 70-200's.  It is in general higher as the day goes on.  She wants to use up lantus before changing to a different insulin.  Past Medical History:  Diagnosis Date  . Allergy   . Anemia    history  . Bronchitis    Hx - uses inhaler prn  . Hypertension   . Type I (juvenile type) diabetes mellitus without mention of complication, not stated as uncontrolled    per patient type 2     Past Surgical History:  Procedure Laterality Date  . left eye surgery     at age 30yrs, weak muscle  . missed abortion     elective     Social History   Social History  . Marital status: Single    Spouse name: N/A  . Number of children: N/A  . Years of education: N/A   Occupational History  . Not on file.   Social History Main Topics  . Smoking status: Never Smoker  . Smokeless tobacco: Never Used  . Alcohol use 0.6 oz/week    1 Glasses of wine per week     Comment: occasional  . Drug use: No  . Sexual activity: Yes    Birth control/ protection: Post-menopausal   Other Topics Concern  . Not on file   Social History Narrative  . No narrative on file    Current Outpatient Prescriptions on File Prior to Visit  Medication Sig Dispense Refill  . albuterol (PROVENTIL HFA;VENTOLIN HFA) 108 (90 Base) MCG/ACT inhaler Inhale 2 puffs into the lungs every 6 (six) hours as needed for wheezing or shortness of breath. 1 Inhaler 3  . B-D ULTRAFINE III SHORT PEN 31G X 8 MM MISC USE AS DIRECTED ONCE DAILY  100 each 2  . Fluticasone-Salmeterol (ADVAIR DISKUS) 100-50 MCG/DOSE AEPB Inhale 1 puff into the lungs 2 (two) times daily. 1 each 0  . glucose blood (ONETOUCH VERIO) test strip 1 each by Other route 2 (two) times daily. And lancets 2/day 250.01 100 each 12  . losartan-hydrochlorothiazide (HYZAAR) 100-25 MG tablet Take 1 tablet by mouth daily. 90 tablet 3  . Omega-3 Fatty Acids (OMEGA 3 PO) Take 1 tablet by mouth.    Glory Rosebush DELICA LANCETS 26S MISC USE AS DIRECTED TWICE A DAY 100 each 8  . triamcinolone cream (KENALOG) 0.1 % APPLY 1 APPLICATION 3 (THREE) TIMES DAILY AS NEEDED FOR ITCHING OR RASH 45 g 4   No current facility-administered medications on file prior to visit.     No Known Allergies  Family History  Problem Relation Age of Onset  . Colon cancer Neg Hx   . Esophageal cancer Neg Hx   . Rectal cancer Neg Hx   . Stomach cancer Neg Hx     BP 140/90 (BP Location: Left Arm, Patient Position: Sitting)   Pulse 80   Wt 244 lb (110.7 kg)   LMP 07/16/2012   SpO2 98%   BMI 49.28 kg/m  Review of Systems Denies LOC    Objective:   Physical Exam VITAL SIGNS:  See vs page GENERAL: no distress Pulses: dorsalis pedis intact bilat.   MSK: no deformity of the feet CV: no leg edema Skin:  no ulcer on the feet.  normal color and temp on the feet. Neuro: sensation is intact to touch on the feet.  Lab Results  Component Value Date   HGBA1C 10.3 12/18/2016      Assessment & Plan:  Insulin-requiring type 2 DM: Based on the pattern of her cbg's, she needs a faster-acting QD insulin.    Patient Instructions  check your blood sugar twice a day.  vary the time of day when you check, between before the 3 meals, and at bedtime.  also check if you have symptoms of your blood sugar being too high or too low.  please keep a record of the readings and bring it to your next appointment here (or you can bring the meter itself).  You can write it on any piece of paper.  please call us  sooner if your blood sugar goes below 70, or if you have a lot of readings over 200.   Please change the insulin to "NPH," 120 units qam.  On this type of insulin schedule, you should eat meals on a regular schedule.  If a meal is missed or significantly delayed, your blood sugar could go low.   Please come back for a follow-up appointment in 3 months.

## 2016-12-18 NOTE — Patient Instructions (Addendum)
check your blood sugar twice a day.  vary the time of day when you check, between before the 3 meals, and at bedtime.  also check if you have symptoms of your blood sugar being too high or too low.  please keep a record of the readings and bring it to your next appointment here (or you can bring the meter itself).  You can write it on any piece of paper.  please call us sooner if your blood sugar goes below 70, or if you have a lot of readings over 200.   Please change the insulin to "NPH," 120 units qam.  On this type of insulin schedule, you should eat meals on a regular schedule.  If a meal is missed or significantly delayed, your blood sugar could go low.   Please come back for a follow-up appointment in 3 months.

## 2017-03-06 ENCOUNTER — Other Ambulatory Visit: Payer: Self-pay | Admitting: Endocrinology

## 2017-03-07 ENCOUNTER — Other Ambulatory Visit: Payer: Self-pay

## 2017-03-07 MED ORDER — LOSARTAN POTASSIUM-HCTZ 100-25 MG PO TABS: 1.0000 | ORAL_TABLET | Freq: Every day | ORAL | 3 refills | Status: DC

## 2017-03-27 ENCOUNTER — Ambulatory Visit: Payer: Managed Care, Other (non HMO) | Admitting: Endocrinology

## 2017-04-26 ENCOUNTER — Ambulatory Visit: Payer: Managed Care, Other (non HMO) | Admitting: Endocrinology

## 2017-04-26 DIAGNOSIS — Z0289 Encounter for other administrative examinations: Secondary | ICD-10-CM

## 2017-10-23 ENCOUNTER — Other Ambulatory Visit: Payer: Self-pay | Admitting: Endocrinology

## 2017-10-23 ENCOUNTER — Ambulatory Visit: Payer: Managed Care, Other (non HMO) | Admitting: Endocrinology

## 2017-10-23 ENCOUNTER — Encounter: Payer: Self-pay | Admitting: Endocrinology

## 2017-10-23 VITALS — BP 179/102 | HR 95 | Wt 208.0 lb

## 2017-10-23 DIAGNOSIS — D649 Anemia, unspecified: Secondary | ICD-10-CM

## 2017-10-23 DIAGNOSIS — Z0000 Encounter for general adult medical examination without abnormal findings: Secondary | ICD-10-CM | POA: Diagnosis not present

## 2017-10-23 DIAGNOSIS — E1165 Type 2 diabetes mellitus with hyperglycemia: Secondary | ICD-10-CM | POA: Diagnosis not present

## 2017-10-23 LAB — CBC WITH DIFFERENTIAL/PLATELET
BASOS PCT: 0.5 % (ref 0.0–3.0)
Basophils Absolute: 0 10*3/uL (ref 0.0–0.1)
Eosinophils Absolute: 0.1 10*3/uL (ref 0.0–0.7)
Eosinophils Relative: 1.7 % (ref 0.0–5.0)
HEMATOCRIT: 42.6 % (ref 36.0–46.0)
Hemoglobin: 13.8 g/dL (ref 12.0–15.0)
LYMPHS PCT: 22.5 % (ref 12.0–46.0)
Lymphs Abs: 1.9 10*3/uL (ref 0.7–4.0)
MCHC: 32.5 g/dL (ref 30.0–36.0)
MCV: 79.8 fl (ref 78.0–100.0)
MONOS PCT: 5.9 % (ref 3.0–12.0)
Monocytes Absolute: 0.5 10*3/uL (ref 0.1–1.0)
NEUTROS ABS: 5.8 10*3/uL (ref 1.4–7.7)
Neutrophils Relative %: 69.4 % (ref 43.0–77.0)
PLATELETS: 194 10*3/uL (ref 150.0–400.0)
RBC: 5.34 Mil/uL — ABNORMAL HIGH (ref 3.87–5.11)
RDW: 13.3 % (ref 11.5–15.5)
WBC: 8.4 10*3/uL (ref 4.0–10.5)

## 2017-10-23 LAB — BASIC METABOLIC PANEL
BUN: 9 mg/dL (ref 6–23)
CHLORIDE: 96 meq/L (ref 96–112)
CO2: 27 meq/L (ref 19–32)
CREATININE: 0.55 mg/dL (ref 0.40–1.20)
Calcium: 9.5 mg/dL (ref 8.4–10.5)
GFR: 145.42 mL/min (ref >60.00)
Glucose, Bld: 332 mg/dL — ABNORMAL HIGH (ref 70–99)
Potassium: 3.6 mEq/L (ref 3.5–5.1)
SODIUM: 133 meq/L — AB (ref 135–145)

## 2017-10-23 LAB — URINALYSIS, ROUTINE W REFLEX MICROSCOPIC
BILIRUBIN URINE: NEGATIVE
NITRITE: NEGATIVE
PH: 6 (ref 5.0–8.0)
SPECIFIC GRAVITY, URINE: 1.015 (ref 1.000–1.030)
Total Protein, Urine: NEGATIVE
UROBILINOGEN UA: 0.2 (ref 0.0–1.0)

## 2017-10-23 LAB — IBC PANEL
Iron: 36 ug/dL — ABNORMAL LOW (ref 42–145)
Saturation Ratios: 10.6 % — ABNORMAL LOW (ref 20.0–50.0)
Transferrin: 242 mg/dL (ref 212.0–360.0)

## 2017-10-23 LAB — HEPATIC FUNCTION PANEL
ALT: 17 U/L (ref 0–35)
AST: 18 U/L (ref 0–37)
Albumin: 4.1 g/dL (ref 3.5–5.2)
Alkaline Phosphatase: 67 U/L (ref 39–117)
Bilirubin, Direct: 0.1 mg/dL (ref 0.0–0.3)
TOTAL PROTEIN: 7.8 g/dL (ref 6.0–8.3)
Total Bilirubin: 0.5 mg/dL (ref 0.2–1.2)

## 2017-10-23 LAB — LIPID PANEL
CHOL/HDL RATIO: 3
Cholesterol: 159 mg/dL (ref 0–200)
HDL: 52.8 mg/dL (ref >39.00)
LDL Cholesterol: 94 mg/dL (ref 0–99)
NONHDL: 106.66
TRIGLYCERIDES: 62 mg/dL (ref 0.0–149.0)
VLDL: 12.4 mg/dL (ref 0.0–40.0)

## 2017-10-23 LAB — MICROALBUMIN / CREATININE URINE RATIO
CREATININE, U: 45.9 mg/dL
Microalb Creat Ratio: 21.8 mg/g (ref 0.0–30.0)
Microalb, Ur: 10 mg/dL — ABNORMAL HIGH (ref 0.0–1.9)

## 2017-10-23 LAB — POCT GLYCOSYLATED HEMOGLOBIN (HGB A1C): Hemoglobin A1C: 14.9

## 2017-10-23 LAB — TSH: TSH: 0.97 u[IU]/mL (ref 0.35–4.50)

## 2017-10-23 MED ORDER — ONETOUCH DELICA LANCETS 33G MISC: 8 refills | Status: AC

## 2017-10-23 MED ORDER — ALBUTEROL SULFATE HFA 108 (90 BASE) MCG/ACT IN AERS: 2.0000 | INHALATION_SPRAY | Freq: Four times a day (QID) | RESPIRATORY_TRACT | 3 refills | Status: DC | PRN

## 2017-10-23 MED ORDER — TRIAMCINOLONE ACETONIDE 0.1 % EX CREA: TOPICAL_CREAM | CUTANEOUS | 4 refills | Status: DC

## 2017-10-23 MED ORDER — INSULIN ISOPHANE HUMAN 100 UNIT/ML KWIKPEN: 120.0000 [IU] | PEN_INJECTOR | SUBCUTANEOUS | 11 refills | Status: DC

## 2017-10-23 MED ORDER — LOSARTAN POTASSIUM-HCTZ 100-25 MG PO TABS: 1.0000 | ORAL_TABLET | Freq: Every day | ORAL | 3 refills | Status: DC

## 2017-10-23 MED ORDER — INSULIN PEN NEEDLE 31G X 8 MM MISC: 2 refills | Status: DC

## 2017-10-23 NOTE — Telephone Encounter (Signed)
novolin NPH is ok

## 2017-10-23 NOTE — Progress Notes (Signed)
Subjective:    Patient ID: Kaylee Lynn, female    DOB: 11/04/1960, 57 y.o.   MRN: [id number]  HPI Pt is here for regular wellness examination, and is feeling pretty well in general, and says chronic med probs are stable, except as noted below Past Medical History:  Diagnosis Date  . Allergy   . Anemia    history  . Bronchitis    Hx - uses inhaler prn  . Hypertension   . Type I (juvenile type) diabetes mellitus without mention of complication, not stated as uncontrolled    per patient type 2     Past Surgical History:  Procedure Laterality Date  . left eye surgery     at age 73yrs, weak muscle  . missed abortion     elective     Social History   Socioeconomic History  . Marital status: Single    Spouse name: Not on file  . Number of children: Not on file  . Years of education: Not on file  . Highest education level: Not on file  Occupational History  . Not on file  Social Needs  . Financial resource strain: Not on file  . Food insecurity:    Worry: Not on file    Inability: Not on file  . Transportation needs:    Medical: Not on file    Non-medical: Not on file  Tobacco Use  . Smoking status: Never Smoker  . Smokeless tobacco: Never Used  Substance and Sexual Activity  . Alcohol use: Yes    Alcohol/week: 0.6 oz    Types: 1 Glasses of wine per week    Comment: occasional  . Drug use: No  . Sexual activity: Yes    Birth control/protection: Post-menopausal  Lifestyle  . Physical activity:    Days per week: Not on file    Minutes per session: Not on file  . Stress: Not on file  Relationships  . Social connections:    Talks on phone: Not on file    Gets together: Not on file    Attends religious service: Not on file    Active member of club or organization: Not on file    Attends meetings of clubs or organizations: Not on file    Relationship status: Not on file  . Intimate partner violence:    Fear of current or ex partner: Not on file   Emotionally abused: Not on file    Physically abused: Not on file    Forced sexual activity: Not on file  Other Topics Concern  . Not on file  Social History Narrative  . Not on file    Current Outpatient Medications on File Prior to Visit  Medication Sig Dispense Refill  . Fluticasone-Salmeterol (ADVAIR DISKUS) 100-50 MCG/DOSE AEPB Inhale 1 puff into the lungs 2 (two) times daily. (Patient not taking: Reported on 10/23/2017) 1 each 0  . glucose blood (ONETOUCH VERIO) test strip 1 each by Other route 2 (two) times daily. And lancets 2/day 250.01 100 each 12  . Omega-3 Fatty Acids (OMEGA 3 PO) Take 1 tablet by mouth.     No current facility-administered medications on file prior to visit.     No Known Allergies  Family History  Problem Relation Age of Onset  . Colon cancer Neg Hx   . Esophageal cancer Neg Hx   . Rectal cancer Neg Hx   . Stomach cancer Neg Hx     BP (!) 179/102  Pulse 95   Wt 208 lb (94.3 kg)   LMP 07/16/2012   SpO2 97%   BMI 42.01 kg/m     Review of Systems Denies fever, fatigue, visual loss, hearing loss, back pain, depression, cold intolerance, BRBPR, hematuria, syncope, numbness, easy bruising, and rash.  She has rhinorrhea.    Objective:   Physical Exam VS: see vs page GEN: no distress HEAD: head: no deformity eyes: no periorbital swelling, no proptosis external nose and ears are normal mouth: no lesion seen NECK: supple, thyroid is not enlarged CHEST WALL: no deformity LUNGS:  Clear to auscultation CV: reg rate and rhythm, no murmur ABD: abdomen is soft, nontender.  no hepatosplenomegaly.  not distended.  no hernia MUSCULOSKELETAL: muscle bulk and strength are grossly normal.  no obvious joint swelling.  gait is normal and steady PULSES: no carotid bruit NEURO:  cn 2-12 grossly intact.   readily moves all 4's.  SKIN:  Normal texture and temperature.  No rash or suspicious lesion is visible.   NODES:  None palpable at the neck PSYCH: alert,  well-oriented.  Does not appear anxious nor depressed.  I personally reviewed electrocardiogram tracing (today): Indication: DM Impression: NSR.  No MI.  No hypertrophy.  Flat T-waves Compared to 2018: no significant change.      Assessment & Plan:  Wellness visit today, with problems stable, except as noted.    SEPARATE EVALUATION FOLLOWS--EACH PROBLEM HERE IS NEW, NOT RESPONDING TO TREATMENT, OR POSES SIGNIFICANT RISK TO THE PATIENT'S HEALTH: HISTORY OF THE PRESENT ILLNESS: Pt returns for f/u of diabetes mellitus: DM type: Insulin-requiring type 2 Dx'ed: 2008.   Complications: none.  Therapy: insulin since 2015.  GDM: never.  DKA: never.   Severe hypoglycemia: never.   Pancreatitis: never.   Other: she declined bariatric surgery; she declines multiple daily injections.  Interval history: she has not recently taken any of her meds.  pt states she feels well in general. PAST MEDICAL HISTORY Past Medical History:  Diagnosis Date  . Allergy   . Anemia    history  . Bronchitis    Hx - uses inhaler prn  . Hypertension   . Type I (juvenile type) diabetes mellitus without mention of complication, not stated as uncontrolled    per patient type 2     Past Surgical History:  Procedure Laterality Date  . left eye surgery     at age 60yrs, weak muscle  . missed abortion     elective     Social History   Socioeconomic History  . Marital status: Single    Spouse name: Not on file  . Number of children: Not on file  . Years of education: Not on file  . Highest education level: Not on file  Occupational History  . Not on file  Social Needs  . Financial resource strain: Not on file  . Food insecurity:    Worry: Not on file    Inability: Not on file  . Transportation needs:    Medical: Not on file    Non-medical: Not on file  Tobacco Use  . Smoking status: Never Smoker  . Smokeless tobacco: Never Used  Substance and Sexual Activity  . Alcohol use: Yes     Alcohol/week: 0.6 oz    Types: 1 Glasses of wine per week    Comment: occasional  . Drug use: No  . Sexual activity: Yes    Birth control/protection: Post-menopausal  Lifestyle  . Physical activity:  Days per week: Not on file    Minutes per session: Not on file  . Stress: Not on file  Relationships  . Social connections:    Talks on phone: Not on file    Gets together: Not on file    Attends religious service: Not on file    Active member of club or organization: Not on file    Attends meetings of clubs or organizations: Not on file    Relationship status: Not on file  . Intimate partner violence:    Fear of current or ex partner: Not on file    Emotionally abused: Not on file    Physically abused: Not on file    Forced sexual activity: Not on file  Other Topics Concern  . Not on file  Social History Narrative  . Not on file    Current Outpatient Medications on File Prior to Visit  Medication Sig Dispense Refill  . Fluticasone-Salmeterol (ADVAIR DISKUS) 100-50 MCG/DOSE AEPB Inhale 1 puff into the lungs 2 (two) times daily. (Patient not taking: Reported on 10/23/2017) 1 each 0  . glucose blood (ONETOUCH VERIO) test strip 1 each by Other route 2 (two) times daily. And lancets 2/day 250.01 100 each 12  . Omega-3 Fatty Acids (OMEGA 3 PO) Take 1 tablet by mouth.     No current facility-administered medications on file prior to visit.     No Known Allergies  Family History  Problem Relation Age of Onset  . Colon cancer Neg Hx   . Esophageal cancer Neg Hx   . Rectal cancer Neg Hx   . Stomach cancer Neg Hx     BP (!) 179/102   Pulse 95   Wt 208 lb (94.3 kg)   LMP 07/16/2012   SpO2 97%   BMI 42.01 kg/m   REVIEW OF SYSTEMS: Denies chest pain and sob PHYSICAL EXAMINATION: VITAL SIGNS:  See vs page GENERAL: no distress Pulses: dorsalis pedis intact bilat.   MSK: no deformity of the feet CV: no leg edema Skin:  no ulcer on the feet.  normal color and temp on the  feet. Neuro: sensation is intact to touch on the feet LAB/XRAY RESULTS: Lab Results  Component Value Date   HGBA1C 14.9 10/23/2017   IMPRESSION: Insulin-requiring type 2 DM: Noncompliance with cbg recording: I'll work around this as best I can HTN: poor control off medication PLAN:  I have sent a prescription to your pharmacy, to resume meds

## 2017-10-23 NOTE — Patient Instructions (Addendum)
check your blood sugar twice a day.  vary the time of day when you check, between before the 3 meals, and at bedtime.  also check if you have symptoms of your blood sugar being too high or too low.  please keep a record of the readings and bring it to your next appointment here (or you can bring the meter itself).  You can write it on any piece of paper.  please call us sooner if your blood sugar goes below 70, or if you have a lot of readings over 200.   On this type of insulin schedule, you should eat meals on a regular schedule.  If a meal is missed or significantly delayed, your blood sugar could go low.   I have sent a prescription to your pharmacy, to renew your medications.  blood tests are requested for you today.  We'll let you know about the results.  Please come back for a regular physical appointment in 2-3 weeks.

## 2017-10-25 ENCOUNTER — Other Ambulatory Visit: Payer: Self-pay | Admitting: Endocrinology

## 2017-10-25 ENCOUNTER — Other Ambulatory Visit: Payer: Self-pay

## 2017-10-25 MED ORDER — "INSULIN SYRINGE-NEEDLE U-100 31G X 15/64"" 1 ML MISC": 11 refills | Status: DC

## 2017-10-25 MED ORDER — INSULIN NPH (HUMAN) (ISOPHANE) 100 UNIT/ML ~~LOC~~ SUSP: SUBCUTANEOUS | 11 refills | Status: DC

## 2017-10-30 ENCOUNTER — Telehealth: Payer: Self-pay | Admitting: Obstetrics and Gynecology

## 2017-10-30 ENCOUNTER — Other Ambulatory Visit: Payer: Self-pay | Admitting: Endocrinology

## 2017-10-30 NOTE — Telephone Encounter (Signed)
Called and left a message for patient to call back to schedule a new patient doctor referral appointment with our office to see any provider for an AEX. °

## 2017-10-31 ENCOUNTER — Other Ambulatory Visit: Payer: Self-pay

## 2017-10-31 NOTE — Telephone Encounter (Signed)
Called and left a message for patient to call back to schedule a new patient doctor referral appointment with our office to see any provider for an AEX. °

## 2017-11-06 NOTE — Telephone Encounter (Signed)
Called and left a message for patient to call back to schedule a new patient doctor referral appointment with our office to see any provider for an AEX. °

## 2017-11-07 NOTE — Telephone Encounter (Signed)
Routing referral back to referring office. Patient has not returned multiple calls to schedule an appointment.

## 2017-11-20 ENCOUNTER — Ambulatory Visit: Payer: Managed Care, Other (non HMO) | Admitting: Endocrinology

## 2017-11-20 DIAGNOSIS — Z0289 Encounter for other administrative examinations: Secondary | ICD-10-CM

## 2018-07-08 ENCOUNTER — Ambulatory Visit (INDEPENDENT_AMBULATORY_CARE_PROVIDER_SITE_OTHER): Payer: 59 | Admitting: Endocrinology

## 2018-07-08 ENCOUNTER — Encounter: Payer: Self-pay | Admitting: Endocrinology

## 2018-07-08 VITALS — BP 186/98 | HR 95 | Ht 59.0 in | Wt 192.4 lb

## 2018-07-08 DIAGNOSIS — E1165 Type 2 diabetes mellitus with hyperglycemia: Secondary | ICD-10-CM

## 2018-07-08 LAB — BASIC METABOLIC PANEL
BUN: 13 mg/dL (ref 6–23)
CO2: 25 mEq/L (ref 19–32)
Calcium: 10.2 mg/dL (ref 8.4–10.5)
Chloride: 97 mEq/L (ref 96–112)
Creatinine, Ser: 0.65 mg/dL (ref 0.40–1.20)
GFR: 119.64 mL/min (ref >60.00)
GLUCOSE: 379 mg/dL — AB (ref 70–99)
Potassium: 4.4 mEq/L (ref 3.5–5.1)
SODIUM: 133 meq/L — AB (ref 135–145)

## 2018-07-08 LAB — POCT GLYCOSYLATED HEMOGLOBIN (HGB A1C): HbA1c POC (<> result, manual entry): >15 % (ref 4.0–5.6)

## 2018-07-08 LAB — GLUCOSE, POCT (MANUAL RESULT ENTRY): POC Glucose: 364 mg/dl — AB (ref 70–99)

## 2018-07-08 MED ORDER — BASAGLAR KWIKPEN 100 UNIT/ML ~~LOC~~ SOPN: 120.0000 [IU] | PEN_INJECTOR | SUBCUTANEOUS | 11 refills | Status: DC

## 2018-07-08 MED ORDER — TRIAMCINOLONE ACETONIDE 0.1 % EX CREA: TOPICAL_CREAM | CUTANEOUS | 4 refills | Status: DC

## 2018-07-08 MED ORDER — LOSARTAN POTASSIUM-HCTZ 100-25 MG PO TABS: 1.0000 | ORAL_TABLET | Freq: Every day | ORAL | 3 refills | Status: DC

## 2018-07-08 MED ORDER — GLUCOSE BLOOD VI STRP: ORAL_STRIP | 2 refills | Status: DC

## 2018-07-08 NOTE — Patient Instructions (Addendum)
Please call if you get any swelling or increased pain at you left heel. I have sent these prescription to your pharmacy: skin cream, to refill the blood pressure pill, and for a new cheaper insulin. check your blood sugar twice a day.  vary the time of day when you check, between before the 3 meals, and at bedtime.  also check if you have symptoms of your blood sugar being too high or too low.  please keep a record of the readings and bring it to your next appointment here (or you can bring the meter itself).  You can write it on any piece of paper.  please call us sooner if your blood sugar goes below 70, or if you have a lot of readings over 200. Here is a list of primary care providers.  Please call and schedule an appointment.   Blood tests are requested for you today.  We'll let you know about the results.  Please come back for a follow-up appointment in 2 weeks.

## 2018-07-08 NOTE — Progress Notes (Signed)
Subjective:    Patient ID: Kaylee Lynn, female    DOB: 1960/11/08, 58 y.o.   MRN: [id number]  HPI Pt returns for f/u of diabetes mellitus: DM type: Insulin-requiring type 2 Dx'ed: 2008.   Complications: DR Therapy: insulin since 2015.  GDM: never.  DKA: never.   Severe hypoglycemia: never.   Pancreatitis: never.   Other: she declined bariatric surgery; she declines multiple daily injections.  Interval history: she has not recently taken her insulin, due to cost.  pt states she feels well in general.  She seldom checks cbg.  She accidentally punctured left heel with a sewing needle.  Td is UTD.   She ran out of BP rx.  Past Medical History:  Diagnosis Date  . Allergy   . Anemia    history  . Bronchitis    Hx - uses inhaler prn  . Hypertension   . Type I (juvenile type) diabetes mellitus without mention of complication, not stated as uncontrolled    per patient type 2     Past Surgical History:  Procedure Laterality Date  . left eye surgery     at age 2yrs, weak muscle  . missed abortion     elective     Social History   Socioeconomic History  . Marital status: Single    Spouse name: Not on file  . Number of children: Not on file  . Years of education: Not on file  . Highest education level: Not on file  Occupational History  . Not on file  Social Needs  . Financial resource strain: Not on file  . Food insecurity:    Worry: Not on file    Inability: Not on file  . Transportation needs:    Medical: Not on file    Non-medical: Not on file  Tobacco Use  . Smoking status: Never Smoker  . Smokeless tobacco: Never Used  Substance and Sexual Activity  . Alcohol use: Yes    Alcohol/week: 1.0 standard drinks    Types: 1 Glasses of wine per week    Comment: occasional  . Drug use: No  . Sexual activity: Yes    Birth control/protection: Post-menopausal  Lifestyle  . Physical activity:    Days per week: Not on file    Minutes per session: Not on file  .  Stress: Not on file  Relationships  . Social connections:    Talks on phone: Not on file    Gets together: Not on file    Attends religious service: Not on file    Active member of club or organization: Not on file    Attends meetings of clubs or organizations: Not on file    Relationship status: Not on file  . Intimate partner violence:    Fear of current or ex partner: Not on file    Emotionally abused: Not on file    Physically abused: Not on file    Forced sexual activity: Not on file  Other Topics Concern  . Not on file  Social History Narrative  . Not on file    Current Outpatient Medications on File Prior to Visit  Medication Sig Dispense Refill  . albuterol (PROVENTIL HFA;VENTOLIN HFA) 108 (90 Base) MCG/ACT inhaler Inhale 2 puffs into the lungs every 6 (six) hours as needed for wheezing or shortness of breath. 1 Inhaler 3  . glucose blood (ONETOUCH VERIO) test strip 1 each by Other route 2 (two) times daily. And lancets 2/day 250.01  100 each 12  . Omega-3 Fatty Acids (OMEGA 3 PO) Take 1 tablet by mouth.    Glory Rosebush DELICA LANCETS 56P MISC USE AS DIRECTED TWICE A DAY 100 each 8   No current facility-administered medications on file prior to visit.     No Known Allergies  Family History  Problem Relation Age of Onset  . Colon cancer Neg Hx   . Esophageal cancer Neg Hx   . Rectal cancer Neg Hx   . Stomach cancer Neg Hx     BP (!) 186/98 (BP Location: Left Arm, Patient Position: Sitting, Cuff Size: Large) Comment: Did not take antihypertensive. States she is out of Rx  Pulse 95   Ht 4\' 11"  (1.499 m)   Wt 192 lb 6.4 oz (87.3 kg)   LMP 07/16/2012   SpO2 96%   BMI 38.86 kg/m    Review of Systems She has fatigue and polyuria.  Denies n/v/sob.  She has lost 16 lbs since last ov.      Objective:   Physical Exam VITAL SIGNS:  See vs page GENERAL: no distress Pulses: dorsalis pedis intact bilat.   MSK: no deformity of the feet.  Specifically, I cannot see any  abnormality at the left heel.   CV: no leg edema Skin:  no ulcer on the feet.  normal color and temp on the feet.  Neuro: sensation is intact to touch on the feet.    Lab Results  Component Value Date   HGBA1C >15.0 07/08/2018       Assessment & Plan:  Insulin-requiring type 2 DM, with DR: very poor glycemic control. therapy limited by noncompliance.   Heel puncture wound, new HTN: therapy limited by noncompliance.   Weight loss: prob due to severe hyperglycemia: we'll follow.   Patient Instructions  Please call if you get any swelling or increased pain at you left heel. I have sent these prescription to your pharmacy: skin cream, to refill the blood pressure pill, and for a new cheaper insulin. check your blood sugar twice a day.  vary the time of day when you check, between before the 3 meals, and at bedtime.  also check if you have symptoms of your blood sugar being too high or too low.  please keep a record of the readings and bring it to your next appointment here (or you can bring the meter itself).  You can write it on any piece of paper.  please call us sooner if your blood sugar goes below 70, or if you have a lot of readings over 200. Here is a list of primary care providers.  Please call and schedule an appointment.   Blood tests are requested for you today.  We'll let you know about the results.  Please come back for a follow-up appointment in 2 weeks.

## 2018-07-09 LAB — HM DIABETES EYE EXAM

## 2018-07-15 ENCOUNTER — Telehealth: Payer: Self-pay | Admitting: Endocrinology

## 2018-07-15 NOTE — Telephone Encounter (Signed)
Called pharmacy and spoke with pharmacist re: pt c/o. States they DO have the Rx, HAVE filled Rx and IS ready for pick up. Called pt and LVM.

## 2018-07-15 NOTE — Telephone Encounter (Signed)
Patient stated that the pharmacy did not receive the prescription for her blood pressure medication and would like it resent      [id number] - [place], [place] [address]

## 2018-07-25 ENCOUNTER — Ambulatory Visit: Payer: 59 | Admitting: Endocrinology

## 2018-07-25 ENCOUNTER — Encounter: Payer: Self-pay | Admitting: Endocrinology

## 2018-07-25 VITALS — BP 152/88 | HR 93 | Ht 59.0 in | Wt 209.0 lb

## 2018-07-25 DIAGNOSIS — E1165 Type 2 diabetes mellitus with hyperglycemia: Secondary | ICD-10-CM

## 2018-07-25 LAB — POCT GLYCOSYLATED HEMOGLOBIN (HGB A1C): HbA1c POC (<> result, manual entry): >14.9 % (ref 4.0–5.6)

## 2018-07-25 NOTE — Progress Notes (Signed)
Subjective:    Patient ID: Kaylee Lynn, female    DOB: 04-20-1961, 58 y.o.   MRN: [id number]  HPI Pt returns for f/u of diabetes mellitus: DM type: Insulin-requiring type 2 Dx'ed: 2008.   Complications: DR Therapy: insulin since 2015.  GDM: never.  DKA: never.   Severe hypoglycemia: never.   Pancreatitis: never.   Other: she declined bariatric surgery; she declines multiple daily injections.   Interval history: She has been back on insulin x 2 weeks.  She takes as rx'ed.  She scraped both ant tibial areas this am.  She says her meter broke.   Past Medical History:  Diagnosis Date  . Allergy   . Anemia    history  . Bronchitis    Hx - uses inhaler prn  . Hypertension   . Type I (juvenile type) diabetes mellitus without mention of complication, not stated as uncontrolled    per patient type 2     Past Surgical History:  Procedure Laterality Date  . left eye surgery     at age 18yrs, weak muscle  . missed abortion     elective     Social History   Socioeconomic History  . Marital status: Single    Spouse name: Not on file  . Number of children: Not on file  . Years of education: Not on file  . Highest education level: Not on file  Occupational History  . Not on file  Social Needs  . Financial resource strain: Not on file  . Food insecurity:    Worry: Not on file    Inability: Not on file  . Transportation needs:    Medical: Not on file    Non-medical: Not on file  Tobacco Use  . Smoking status: Never Smoker  . Smokeless tobacco: Never Used  Substance and Sexual Activity  . Alcohol use: Yes    Alcohol/week: 1.0 standard drinks    Types: 1 Glasses of wine per week    Comment: occasional  . Drug use: No  . Sexual activity: Yes    Birth control/protection: Post-menopausal  Lifestyle  . Physical activity:    Days per week: Not on file    Minutes per session: Not on file  . Stress: Not on file  Relationships  . Social connections:    Talks on  phone: Not on file    Gets together: Not on file    Attends religious service: Not on file    Active member of club or organization: Not on file    Attends meetings of clubs or organizations: Not on file    Relationship status: Not on file  . Intimate partner violence:    Fear of current or ex partner: Not on file    Emotionally abused: Not on file    Physically abused: Not on file    Forced sexual activity: Not on file  Other Topics Concern  . Not on file  Social History Narrative  . Not on file    Current Outpatient Medications on File Prior to Visit  Medication Sig Dispense Refill  . albuterol (PROVENTIL HFA;VENTOLIN HFA) 108 (90 Base) MCG/ACT inhaler Inhale 2 puffs into the lungs every 6 (six) hours as needed for wheezing or shortness of breath. 1 Inhaler 3  . glucose blood (ONETOUCH VERIO) test strip 1 each by Other route 2 (two) times daily. And lancets 2/day 250.01 100 each 12  . Insulin Glargine (BASAGLAR KWIKPEN) 100 UNIT/ML SOPN Inject 1.2  mLs (120 Units total) into the skin every morning. And pen needles 2/day 15 pen 11  . losartan-hydrochlorothiazide (HYZAAR) 100-25 MG tablet Take 1 tablet by mouth daily. 90 tablet 3  . Omega-3 Fatty Acids (OMEGA 3 PO) Take 1 tablet by mouth.    Glory Rosebush DELICA LANCETS 41U MISC USE AS DIRECTED TWICE A DAY 100 each 8  . triamcinolone cream (KENALOG) 0.1 % APPLY 1 APPLICATION 3 (THREE) TIMES DAILY AS NEEDED FOR ITCHING OR RASH 45 g 4   No current facility-administered medications on file prior to visit.     No Known Allergies  Family History  Problem Relation Age of Onset  . Colon cancer Neg Hx   . Esophageal cancer Neg Hx   . Rectal cancer Neg Hx   . Stomach cancer Neg Hx     BP (!) 152/88 (BP Location: Right Arm, Patient Position: Sitting, Cuff Size: Normal)   Pulse 93   Ht 4\' 11"  (1.499 m)   Wt 209 lb (94.8 kg)   LMP 07/16/2012   SpO2 97%   BMI 42.21 kg/m   Review of Systems Denies n/v.     Objective:   Physical  Exam VITAL SIGNS:  See vs page GENERAL: no distress Pulses: dorsalis pedis intact bilat.   MSK: no deformity of the feet.  CV: no leg edema Skin:  no ulcer on the feet.  There are abrasions on both ant tibial areas.  normal color and temp on the feet.  Neuro: sensation is intact to touch on the feet.   Lab Results  Component Value Date   HGBA1C >14.9 07/25/2018       Assessment & Plan:  Insulin-requiring type 2 DM, with DR: much worse.  HTN: is noted today.  Leg abrasions: new  Patient Instructions  Your blood pressure is high today.  Please see your new primary care provider soon, to have it rechecked check your blood sugar twice a day.  vary the time of day when you check, between before the 3 meals, and at bedtime.  also check if you have symptoms of your blood sugar being too high or too low.  please keep a record of the readings and bring it to your next appointment here (or you can bring the meter itself).  You can write it on any piece of paper.  please call us sooner if your blood sugar goes below 70, or if you have a lot of readings over 200. Please keep the scrapes on your legs covered with antibiotic ointment and bandaids, until they heal Please see Mickel Baas, to make sure there are no problems with insulin injections.   Here is a new meter.  Please come back for a follow-up appointment in 2 weeks.

## 2018-07-25 NOTE — Patient Instructions (Addendum)
Your blood pressure is high today.  Please see your new primary care provider soon, to have it rechecked check your blood sugar twice a day.  vary the time of day when you check, between before the 3 meals, and at bedtime.  also check if you have symptoms of your blood sugar being too high or too low.  please keep a record of the readings and bring it to your next appointment here (or you can bring the meter itself).  You can write it on any piece of paper.  please call us sooner if your blood sugar goes below 70, or if you have a lot of readings over 200. Please keep the scrapes on your legs covered with antibiotic ointment and bandaids, until they heal Please see Mickel Baas, to make sure there are no problems with insulin injections.   Here is a new meter.  Please come back for a follow-up appointment in 2 weeks.

## 2018-08-08 ENCOUNTER — Ambulatory Visit: Payer: 59 | Admitting: Endocrinology

## 2018-09-03 ENCOUNTER — Ambulatory Visit: Payer: 59 | Admitting: Endocrinology

## 2018-11-21 ENCOUNTER — Telehealth: Payer: Self-pay

## 2018-11-21 NOTE — Telephone Encounter (Signed)
LOV 07/25/18. Called pt to schedule f/u appt. LVM requesting returned call.

## 2018-12-22 ENCOUNTER — Other Ambulatory Visit: Payer: Self-pay

## 2018-12-24 ENCOUNTER — Ambulatory Visit: Payer: 59 | Admitting: Endocrinology

## 2019-01-05 ENCOUNTER — Other Ambulatory Visit: Payer: Self-pay

## 2019-01-06 ENCOUNTER — Ambulatory Visit: Payer: 59 | Admitting: Endocrinology

## 2019-01-06 ENCOUNTER — Other Ambulatory Visit: Payer: Self-pay

## 2019-01-06 ENCOUNTER — Encounter: Payer: Self-pay | Admitting: Endocrinology

## 2019-01-06 VITALS — BP 198/98 | HR 95 | Ht 59.0 in | Wt 248.8 lb

## 2019-01-06 DIAGNOSIS — E11319 Type 2 diabetes mellitus with unspecified diabetic retinopathy without macular edema: Secondary | ICD-10-CM

## 2019-01-06 DIAGNOSIS — E1165 Type 2 diabetes mellitus with hyperglycemia: Secondary | ICD-10-CM | POA: Diagnosis not present

## 2019-01-06 DIAGNOSIS — Z794 Long term (current) use of insulin: Secondary | ICD-10-CM

## 2019-01-06 LAB — POCT GLYCOSYLATED HEMOGLOBIN (HGB A1C): Hemoglobin A1C: 11.1 % — AB (ref 4.0–5.6)

## 2019-01-06 MED ORDER — FREESTYLE LIBRE 14 DAY READER DEVI: 1.0000 | Freq: Once | 0 refills | Status: AC

## 2019-01-06 MED ORDER — FREESTYLE LIBRE 14 DAY SENSOR MISC: 1.0000 | 3 refills | Status: DC

## 2019-01-06 NOTE — Progress Notes (Signed)
Subjective:    Patient ID: Kaylee Lynn, female    DOB: 1960/08/10, 58 y.o.   MRN: [id number]  HPI Pt returns for f/u of diabetes mellitus: DM type: Insulin-requiring type 2 Dx'ed: 2008.   Complications: DR Therapy: insulin since 2015.  GDM: never.  DKA: never.   Severe hypoglycemia: never.   Pancreatitis: never.   Other: she declined bariatric surgery; she declines multiple daily injections.  Ins declined NPH (vials and pens) Interval history: no cbg record, but states cbg's vary from 60-301.  It is in general higher as the day goes on. It was low this am, after she took 120 extra units yesterday afternoon, due to high cbg.  She also took a PM dose, as PM cbg was high.  She sometimes misses the insulin altogether.   Past Medical History:  Diagnosis Date  . Allergy   . Anemia    history  . Bronchitis    Hx - uses inhaler prn  . Hypertension   . Type I (juvenile type) diabetes mellitus without mention of complication, not stated as uncontrolled    per patient type 2     Past Surgical History:  Procedure Laterality Date  . left eye surgery     at age 19yrs, weak muscle  . missed abortion     elective     Social History   Socioeconomic History  . Marital status: Single    Spouse name: Not on file  . Number of children: Not on file  . Years of education: Not on file  . Highest education level: Not on file  Occupational History  . Not on file  Social Needs  . Financial resource strain: Not on file  . Food insecurity    Worry: Not on file    Inability: Not on file  . Transportation needs    Medical: Not on file    Non-medical: Not on file  Tobacco Use  . Smoking status: Never Smoker  . Smokeless tobacco: Never Used  Substance and Sexual Activity  . Alcohol use: Yes    Alcohol/week: 1.0 standard drinks    Types: 1 Glasses of wine per week    Comment: occasional  . Drug use: No  . Sexual activity: Yes    Birth control/protection: Post-menopausal   Lifestyle  . Physical activity    Days per week: Not on file    Minutes per session: Not on file  . Stress: Not on file  Relationships  . Social Herbalist on phone: Not on file    Gets together: Not on file    Attends religious service: Not on file    Active member of club or organization: Not on file    Attends meetings of clubs or organizations: Not on file    Relationship status: Not on file  . Intimate partner violence    Fear of current or ex partner: Not on file    Emotionally abused: Not on file    Physically abused: Not on file    Forced sexual activity: Not on file  Other Topics Concern  . Not on file  Social History Narrative  . Not on file    Current Outpatient Medications on File Prior to Visit  Medication Sig Dispense Refill  . albuterol (PROVENTIL HFA;VENTOLIN HFA) 108 (90 Base) MCG/ACT inhaler Inhale 2 puffs into the lungs every 6 (six) hours as needed for wheezing or shortness of breath. 1 Inhaler 3  . glucose  blood (ONETOUCH VERIO) test strip 1 each by Other route 2 (two) times daily. And lancets 2/day 250.01 100 each 12  . Insulin Glargine (BASAGLAR KWIKPEN) 100 UNIT/ML SOPN Inject 1.2 mLs (120 Units total) into the skin every morning. And pen needles 2/day 15 pen 11  . losartan-hydrochlorothiazide (HYZAAR) 100-25 MG tablet Take 1 tablet by mouth daily. 90 tablet 3  . Omega-3 Fatty Acids (OMEGA 3 PO) Take 1 tablet by mouth.    Kaylee Lynn DELICA LANCETS 37C MISC USE AS DIRECTED TWICE A DAY 100 each 8  . triamcinolone cream (KENALOG) 0.1 % APPLY 1 APPLICATION 3 (THREE) TIMES DAILY AS NEEDED FOR ITCHING OR RASH 45 g 4   No current facility-administered medications on file prior to visit.     No Known Allergies  Family History  Problem Relation Age of Onset  . Colon cancer Neg Hx   . Esophageal cancer Neg Hx   . Rectal cancer Neg Hx   . Stomach cancer Neg Hx     BP (!) 198/98 (BP Location: Left Arm, Patient Position: Sitting, Cuff Size: Large)    Pulse 95   Ht 4\' 11"  (1.499 m)   Wt 248 lb 12.8 oz (112.9 kg)   LMP 07/16/2012   SpO2 97%   BMI 50.25 kg/m    Review of Systems She has gained weight.      Objective:   Physical Exam VITAL SIGNS:  See vs page.  GENERAL: no distress.  Morbid obesity.   Pulses: dorsalis pedis intact bilat.   MSK: no deformity of the feet.  CV: 1+ bilat leg edema.   Skin:  no ulcer on the feet.  normal color and temp on the feet.  Neuro: sensation is intact to touch on the feet.   Lab Results  Component Value Date   CREATININE 0.65 07/08/2018   BUN 13 07/08/2018   NA 133 (L) 07/08/2018   K 4.4 07/08/2018   CL 97 07/08/2018   CO2 25 07/08/2018    Lab Results  Component Value Date   HGBA1C 11.1 (A) 01/06/2019       Assessment & Plan:  HTN: is noted today Insulin-requiring type 2 DM, with DR: poor glycemic control. Hypoglycemia: this limits aggressiveness of glycemic control, so we'll have to increase insulin slowly.   Patient Instructions  Your blood pressure is high today.  Please see your new primary care provider soon, to have it rechecked check your blood sugar twice a day.  vary the time of day when you check, between before the 3 meals, and at bedtime.  also check if you have symptoms of your blood sugar being too high or too low.  please keep a record of the readings and bring it to your next appointment here (or you can bring the meter itself).  You can write it on any piece of paper.  please call us sooner if your blood sugar goes below 70, or if you have a lot of readings over 200. Please take 120 units every morning, and none later in the day, no matter what your blood sugar is.  To avoid it going low in the morning, eat a light snack at bedtime. Please come back for a follow-up appointment in 6 weeks.

## 2019-01-06 NOTE — Patient Instructions (Addendum)
Your blood pressure is high today.  Please see your new primary care provider soon, to have it rechecked check your blood sugar twice a day.  vary the time of day when you check, between before the 3 meals, and at bedtime.  also check if you have symptoms of your blood sugar being too high or too low.  please keep a record of the readings and bring it to your next appointment here (or you can bring the meter itself).  You can write it on any piece of paper.  please call us sooner if your blood sugar goes below 70, or if you have a lot of readings over 200. Please take 120 units every morning, and none later in the day, no matter what your blood sugar is.  To avoid it going low in the morning, eat a light snack at bedtime. Please come back for a follow-up appointment in 6 weeks.

## 2019-01-06 NOTE — Progress Notes (Signed)
ae

## 2019-01-16 ENCOUNTER — Other Ambulatory Visit: Payer: Self-pay | Admitting: Endocrinology

## 2019-02-17 ENCOUNTER — Ambulatory Visit (HOSPITAL_COMMUNITY)
Admission: EM | Admit: 2019-02-17 | Discharge: 2019-02-17 | Disposition: A | Discharge status: Home / Self Care | Payer: No Typology Code available for payment source | Attending: Family Medicine | Admitting: Family Medicine

## 2019-02-17 ENCOUNTER — Encounter (HOSPITAL_COMMUNITY): Payer: Self-pay | Admitting: Emergency Medicine

## 2019-02-17 DIAGNOSIS — T7840XA Allergy, unspecified, initial encounter: Secondary | ICD-10-CM | POA: Diagnosis not present

## 2019-02-17 DIAGNOSIS — I10 Essential (primary) hypertension: Secondary | ICD-10-CM

## 2019-02-17 MED ORDER — AMLODIPINE BESYLATE 5 MG PO TABS: 5.0000 mg | ORAL_TABLET | Freq: Every day | ORAL | 1 refills | Status: DC

## 2019-02-17 MED ORDER — PREDNISONE 10 MG (21) PO TBPK: ORAL_TABLET | Freq: Every day | ORAL | 0 refills | Status: DC

## 2019-02-17 NOTE — ED Triage Notes (Addendum)
Pt c/o bilateral swelling around her eyes for the last week or so, pt thought it was allergies so took benadryl, it didn't help it just "knocked me out". Pt denies vision issues. Pt also c/o pins in her R hand. Grip strength equal, no neuro deficits, pt feels more sensation on her R hand then left.

## 2019-02-17 NOTE — ED Provider Notes (Signed)
[place] [facility]   [id number] 02/17/19 Arrival Time: [id number]  ASSESSMENT & PLAN:  1. Allergic reaction, initial encounter   2. Uncontrolled hypertension     Likely allergic reaction from shrimp ingestion. Discussed.  To begin: Meds ordered this encounter  Medications  . amLODipine (NORVASC) 5 MG tablet    Sig: Take 1 tablet (5 mg total) by mouth daily.    Dispense:  30 tablet    Refill:  1  . predniSONE (STERAPRED UNI-PAK 21 TAB) 10 MG (21) TBPK tablet    Sig: Take by mouth daily. Take as directed.    Dispense:  21 tablet    Refill:  0   She is planning to est care with PCP. May f/u here for BP recheck at anytime. Add Norvasc. Discussed increased blood sugars while on prednisone; monitor closely.  Reviewed expectations re: course of current medical issues. Questions answered. Outlined signs and symptoms indicating need for more acute intervention. Patient verbalized understanding. After Visit Summary given.   SUBJECTIVE: History from: patient. Kaylee Lynn is a 58 y.o. female who presents for evaluation of a possible allergic reaction. Possible triggers: question related to shrimp she ate before swelling/rash onset. Reports facial swelling with itching. No extremity swelling. Reports abrupt starting approx 48 hours ago. Clinical course: stable. Respiratory symptoms: none. Denies chest pain, shortness of breath and wheezing. No swallowing difficulties. Normal PO intake without n/v. She denies similar previous allergic reactions. Care prior to arrival consisted of Benadryl on day of onset, with minimal relief.  Increased blood pressure noted today. Reports that she is taking her medication as directed.  She reports no chest pain on exertion, no dyspnea on exertion, no swelling of ankles, no orthostatic dizziness or lightheadedness, no orthopnea or paroxysmal nocturnal dyspnea, no palpitations and no intermittent claudication symptoms.  ROS: As per HPI. All other  systems negative.    OBJECTIVE:  Vitals:   02/17/19 1244  BP: (!) 213/85  Pulse: 92  Resp: 18  Temp: 99.2 F (37.3 C)  SpO2: 100%    General appearance: alert; no distress Eyes: PERRLA; EOMI; conjunctiva normal HENT: normocephalic; atraumatic; oral mucosa normal Neck: supple  Lungs: unlabored respirations Heart: regular Abdomen: soft, non-tender Extremities: no cyanosis or edema; symmetrical with no gross deformities Skin: warm and dry; slight facial swelling with rough, irritated skin Neurologic: normal gait Psychological: alert and cooperative; normal mood and affect  No Known Allergies  Past Medical History:  Diagnosis Date  . Allergy   . Anemia    history  . Bronchitis    Hx - uses inhaler prn  . Hypertension   . Type I (juvenile type) diabetes mellitus without mention of complication, not stated as uncontrolled    per patient type 2    Social History   Socioeconomic History  . Marital status: Single    Spouse name: Not on file  . Number of children: Not on file  . Years of education: Not on file  . Highest education level: Not on file  Occupational History  . Not on file  Social Needs  . Financial resource strain: Not on file  . Food insecurity    Worry: Not on file    Inability: Not on file  . Transportation needs    Medical: Not on file    Non-medical: Not on file  Tobacco Use  . Smoking status: Never Smoker  . Smokeless tobacco: Never Used  Substance and Sexual Activity  . Alcohol use: Yes  Alcohol/week: 1.0 standard drinks    Types: 1 Glasses of wine per week    Comment: occasional  . Drug use: No  . Sexual activity: Yes    Birth control/protection: Post-menopausal  Lifestyle  . Physical activity    Days per week: Not on file    Minutes per session: Not on file  . Stress: Not on file  Relationships  . Social Herbalist on phone: Not on file    Gets together: Not on file    Attends religious service: Not on file     Active member of club or organization: Not on file    Attends meetings of clubs or organizations: Not on file    Relationship status: Not on file  . Intimate partner violence    Fear of current or ex partner: Not on file    Emotionally abused: Not on file    Physically abused: Not on file    Forced sexual activity: Not on file  Other Topics Concern  . Not on file  Social History Narrative  . Not on file   Family History  Problem Relation Age of Onset  . Colon cancer Neg Hx   . Esophageal cancer Neg Hx   . Rectal cancer Neg Hx   . Stomach cancer Neg Hx    Past Surgical History:  Procedure Laterality Date  . left eye surgery     at age 81yrs, weak muscle  . missed abortion     elective      Vanessa Kick, MD 02/17/19 [phone]

## 2019-02-20 ENCOUNTER — Ambulatory Visit: Payer: 59 | Admitting: Endocrinology

## 2019-03-23 ENCOUNTER — Ambulatory Visit: Payer: No Typology Code available for payment source | Admitting: Endocrinology

## 2019-03-23 DIAGNOSIS — Z0289 Encounter for other administrative examinations: Secondary | ICD-10-CM

## 2019-06-05 ENCOUNTER — Other Ambulatory Visit: Payer: Self-pay | Admitting: Endocrinology

## 2019-06-05 NOTE — Telephone Encounter (Signed)
Please advise 

## 2019-06-05 NOTE — Telephone Encounter (Signed)
Please refill x 1 Further refills would have to be considered by new PCP  

## 2019-08-24 ENCOUNTER — Ambulatory Visit: Payer: 59 | Attending: Internal Medicine

## 2019-08-24 DIAGNOSIS — Z23 Encounter for immunization: Secondary | ICD-10-CM | POA: Insufficient documentation

## 2019-08-24 NOTE — Progress Notes (Signed)
   Covid-19 Vaccination Clinic  Name:  Kaylee Lynn    MRN: [id number] DOB: Apr 12, 1961  08/24/2019  Kaylee Lynn was observed post Covid-19 immunization for 15 minutes without incidence. She was provided with Vaccine Information Sheet and instruction to access the V-Safe system.   Kaylee Lynn was instructed to call 911 with any severe reactions post vaccine: Marland Kitchen Difficulty breathing  . Swelling of your face and throat  . A fast heartbeat  . A bad rash all over your body  . Dizziness and weakness

## 2019-09-16 ENCOUNTER — Other Ambulatory Visit: Payer: Self-pay

## 2019-09-16 ENCOUNTER — Ambulatory Visit: Payer: 59 | Attending: Internal Medicine

## 2019-09-16 DIAGNOSIS — Z23 Encounter for immunization: Secondary | ICD-10-CM

## 2019-09-16 NOTE — Progress Notes (Signed)
   Covid-19 Vaccination Clinic  Name:  Kaylee Lynn    MRN: [id number] DOB: 1960/12/05  09/16/2019  Kaylee Lynn was observed post Covid-19 immunization for 15 minutes without incident. She was provided with Vaccine Information Sheet and instruction to access the V-Safe system.   Kaylee Lynn was instructed to call 911 with any severe reactions post vaccine: Marland Kitchen Difficulty breathing  . Swelling of face and throat  . A fast heartbeat  . A bad rash all over body  . Dizziness and weakness   Immunizations Administered    Name Date Dose VIS Date Route   Pfizer COVID-19 Vaccine 09/16/2019 11:03 AM 0.3 mL 06/05/2019 Intramuscular   Manufacturer: [place]   Lot: [id number]   [place]: [id number]

## 2019-11-17 ENCOUNTER — Other Ambulatory Visit: Payer: Self-pay

## 2019-11-19 ENCOUNTER — Encounter: Payer: Self-pay | Admitting: Endocrinology

## 2019-11-19 ENCOUNTER — Ambulatory Visit: Payer: Self-pay | Admitting: Endocrinology

## 2019-11-19 ENCOUNTER — Other Ambulatory Visit: Payer: Self-pay

## 2019-11-19 VITALS — BP 152/80 | HR 87 | Ht 59.0 in | Wt 226.4 lb

## 2019-11-19 DIAGNOSIS — E1165 Type 2 diabetes mellitus with hyperglycemia: Secondary | ICD-10-CM

## 2019-11-19 LAB — POCT GLYCOSYLATED HEMOGLOBIN (HGB A1C): HbA1c POC (<> result, manual entry): >15 % (ref 4.0–5.6)

## 2019-11-19 MED ORDER — ALBUTEROL SULFATE HFA 108 (90 BASE) MCG/ACT IN AERS: 1.0000 | INHALATION_SPRAY | Freq: Four times a day (QID) | RESPIRATORY_TRACT | 0 refills | Status: DC | PRN

## 2019-11-19 MED ORDER — BASAGLAR KWIKPEN 100 UNIT/ML ~~LOC~~ SOPN: 70.0000 [IU] | PEN_INJECTOR | SUBCUTANEOUS | 11 refills | Status: DC

## 2019-11-19 MED ORDER — TRULICITY 0.75 MG/0.5ML ~~LOC~~ SOAJ: 0.7500 mg | SUBCUTANEOUS | 11 refills | Status: DC

## 2019-11-19 MED ORDER — ONETOUCH VERIO VI STRP: 1.0000 | ORAL_STRIP | Freq: Two times a day (BID) | 3 refills | Status: DC

## 2019-11-19 MED ORDER — LOSARTAN POTASSIUM-HCTZ 50-12.5 MG PO TABS: 1.0000 | ORAL_TABLET | Freq: Every day | ORAL | 1 refills | Status: DC

## 2019-11-19 NOTE — Progress Notes (Signed)
Subjective:    Patient ID: Kaylee Lynn, female    DOB: [birth year], 59 y.o.   MRN: [id number]  HPI Pt returns for f/u of diabetes mellitus: DM type: Insulin-requiring type 2 Dx'ed: 2008.   Complications: DR Therapy: insulin since 2015.  GDM: never.  DKA: never.   Severe hypoglycemia: never.   Pancreatitis: never.   Other: she declined bariatric surgery; she declines multiple daily injections.  Ins declined NPH (vials and pens) Interval history: She has not recently taken the insulin.  pt states she feels well in general, except for fatigue. She wants to take Trulicity.  She has a meter, but she needs a refill of strips.   Past Medical History:  Diagnosis Date  . Allergy   . Anemia    history  . Bronchitis    Hx - uses inhaler prn  . Hypertension   . Type I (juvenile type) diabetes mellitus without mention of complication, not stated as uncontrolled    per patient type 2     Past Surgical History:  Procedure Laterality Date  . left eye surgery     at age 25yrs, weak muscle  . missed abortion     elective     Social History   Socioeconomic History  . Marital status: Single    Spouse name: Not on file  . Number of children: Not on file  . Years of education: Not on file  . Highest education level: Not on file  Occupational History  . Not on file  Tobacco Use  . Smoking status: Never Smoker  . Smokeless tobacco: Never Used  Substance and Sexual Activity  . Alcohol use: Yes    Alcohol/week: 1.0 standard drinks    Types: 1 Glasses of wine per week    Comment: occasional  . Drug use: No  . Sexual activity: Yes    Birth control/protection: Post-menopausal  Other Topics Concern  . Not on file  Social History Narrative  . Not on file   Social Determinants of Health   Financial Resource Strain:   . Difficulty of Paying Living Expenses:   Food Insecurity:   . Worried About Charity fundraiser in the Last Year:   . Arboriculturist in the Last Year:     Transportation Needs:   . Film/video editor (Medical):   Marland Kitchen Lack of Transportation (Non-Medical):   Physical Activity:   . Days of Exercise per Week:   . Minutes of Exercise per Session:   Stress:   . Feeling of Stress :   Social Connections:   . Frequency of Communication with Friends and Family:   . Frequency of Social Gatherings with Friends and Family:   . Attends Religious Services:   . Active Member of Clubs or Organizations:   . Attends Archivist Meetings:   Marland Kitchen Marital Status:   Intimate Partner Violence:   . Fear of Current or Ex-Partner:   . Emotionally Abused:   Marland Kitchen Physically Abused:   . Sexually Abused:     Current Outpatient Medications on File Prior to Visit  Medication Sig Dispense Refill  . amLODipine (NORVASC) 5 MG tablet Take 1 tablet (5 mg total) by mouth daily. 30 tablet 1  . losartan-hydrochlorothiazide (HYZAAR) 100-25 MG tablet Take 1 tablet by mouth daily. WILL ONLY PROVIDE 30 DAY SUPPLY. SEND FUTURE REFILLS TO NEW PCP 30 tablet 0  . Omega-3 Fatty Acids (OMEGA 3 PO) Take 1 tablet by mouth.    Marland Kitchen  ONETOUCH DELICA LANCETS [id number] MISC USE AS DIRECTED TWICE A DAY 100 each 8  . triamcinolone cream (KENALOG) 0.1 % APPLY 1 APPLICATION 3 (THREE) TIMES DAILY AS NEEDED FOR ITCHING OR RASH 45 g 4   No current facility-administered medications on file prior to visit.    No Known Allergies  Family History  Problem Relation Age of Onset  . Colon cancer Neg Hx   . Esophageal cancer Neg Hx   . Rectal cancer Neg Hx   . Stomach cancer Neg Hx     BP (!) 152/80   Pulse 87   Ht 4\' 11"  (1.499 m)   Wt 226 lb 6.4 oz (102.7 kg)   LMP 07/16/2012   SpO2 94%   BMI 45.73 kg/m    Review of Systems She has lost 22 lbs since last ov.      Objective:   Physical Exam VITAL SIGNS:  See vs page GENERAL: no distress Pulses: dorsalis pedis intact bilat.   MSK: no deformity of the feet.  CV: no leg edema.   Skin:  no ulcer on the feet.  normal color and temp on  the feet. Neuro: sensation is intact to touch on the feet  Lab Results  Component Value Date   HGBA1C >15 11/19/2019        Assessment & Plan:  Insulin-requiring type 2 DM: severe exacerbation Weight loss, due to the above HTN: is noted today  Patient Instructions  Your blood pressure is high today.  Please see a new primary care provider soon, to have it rechecked.  I have sent a prescription to your pharmacy, to refill for now check your blood sugar twice a day.  vary the time of day when you check, between before the 3 meals, and at bedtime.  also check if you have symptoms of your blood sugar being too high or too low.  please keep a record of the readings and bring it to your next appointment here (or you can bring the meter itself).  You can write it on any piece of paper.  please call us sooner if your blood sugar goes below 70, or if you have a lot of readings over 200. I have also sent a prescription to your pharmacy, for trulicity, to resume the insulin at a lower amount, and for strips. Please come back for a follow-up appointment in 6 weeks.

## 2019-11-19 NOTE — Patient Instructions (Addendum)
Your blood pressure is high today.  Please see a new primary care provider soon, to have it rechecked.  I have sent a prescription to your pharmacy, to refill for now check your blood sugar twice a day.  vary the time of day when you check, between before the 3 meals, and at bedtime.  also check if you have symptoms of your blood sugar being too high or too low.  please keep a record of the readings and bring it to your next appointment here (or you can bring the meter itself).  You can write it on any piece of paper.  please call us sooner if your blood sugar goes below 70, or if you have a lot of readings over 200. I have also sent a prescription to your pharmacy, for trulicity, to resume the insulin at a lower amount, and for strips. Please come back for a follow-up appointment in 6 weeks.

## 2019-11-20 ENCOUNTER — Telehealth: Payer: Self-pay

## 2019-11-20 NOTE — Telephone Encounter (Signed)
PRIOR AUTHORIZATION  PA initiation date: 11/20/19  Medication: [place]: Research officer, trade union completed electronically through [employer] My Meds: Yes  APPROVAL  Medication: [employer]: [place] PA response: APPROVED  FRANCEEN MCCANNON ([id number])  This request has been approved.  Please note any additional information provided by [employer] at the bottom of your screen.  LINDAANN YING Key: [place] Case ID: [id number] - Rx #[id number] Need help? Call us at [phone] Outcome Approvedtoday Your PA request has been approved. Additional information will be provided in the approval communication. (Message [id number]) Drug Trulicity 0.75MG /0.5ML pen-injectors Form Charity fundraiser PA Form [phone] [facility]) [place] [phone]

## 2019-12-24 ENCOUNTER — Telehealth: Payer: Self-pay | Admitting: Endocrinology

## 2019-12-24 NOTE — Telephone Encounter (Signed)
Medication Refill Request  Did you call your pharmacy and request this refill first? Yes  . If patient has not contacted pharmacy first, instruct them to do so for future refills.  . Remind them that contacting the pharmacy for their refill is the quickest method to get the refill.  . Refill policy also stated that it will take anywhere between 24-72 hours to receive the refill.    Name of medication? triamcinolone  Is this a 90 day supply? yes  Name and location of pharmacy?  [id number] [address], [place] Phone:  [phone]  Fax:  [phone]

## 2019-12-24 NOTE — Telephone Encounter (Signed)
Please advise 

## 2019-12-24 NOTE — Telephone Encounter (Signed)
Called pt and informed about Dr. Ellison's response below. Verbalized acceptance and understanding. 

## 2019-12-24 NOTE — Telephone Encounter (Signed)
Please forward refill request to pt's primary care provider.   

## 2020-01-05 ENCOUNTER — Ambulatory Visit: Payer: 59 | Admitting: Endocrinology

## 2020-01-13 ENCOUNTER — Other Ambulatory Visit: Payer: Self-pay | Admitting: Endocrinology

## 2020-01-29 ENCOUNTER — Ambulatory Visit: Payer: No Typology Code available for payment source | Admitting: Endocrinology

## 2020-02-26 ENCOUNTER — Ambulatory Visit (INDEPENDENT_AMBULATORY_CARE_PROVIDER_SITE_OTHER): Payer: No Typology Code available for payment source | Admitting: Endocrinology

## 2020-02-26 ENCOUNTER — Other Ambulatory Visit: Payer: Self-pay

## 2020-02-26 ENCOUNTER — Encounter: Payer: Self-pay | Admitting: Endocrinology

## 2020-02-26 VITALS — BP 142/80 | HR 97 | Ht 59.0 in | Wt 220.0 lb

## 2020-02-26 DIAGNOSIS — E1165 Type 2 diabetes mellitus with hyperglycemia: Secondary | ICD-10-CM

## 2020-02-26 LAB — POCT GLYCOSYLATED HEMOGLOBIN (HGB A1C): Hemoglobin A1C: 7.1 % — AB (ref 4.0–5.6)

## 2020-02-26 MED ORDER — TRULICITY 1.5 MG/0.5ML ~~LOC~~ SOAJ
1.5000 mg | SUBCUTANEOUS | 3 refills | Status: DC
Start: 2020-02-26 — End: 2020-04-29

## 2020-02-26 NOTE — Patient Instructions (Addendum)
Your blood pressure is high today.  Please see a new primary care provider soon, to have it rechecked.   check your blood sugar twice a day.  vary the time of day when you check, between before the 3 meals, and at bedtime.  also check if you have symptoms of your blood sugar being too high or too low.  please keep a record of the readings and bring it to your next appointment here (or you can bring the meter itself).  You can write it on any piece of paper.  please call us sooner if your blood sugar goes below 70, or if you have a lot of readings over 200. I have sent a prescription to your pharmacy, to increase the Trulicity, and:  Please stop taking the [place].   Please come back for a follow-up appointment in 2 months.

## 2020-02-26 NOTE — Progress Notes (Signed)
Subjective:    Patient ID: Kaylee Lynn, female    DOB: 1961-04-12, 59 y.o.   MRN: [id number]  HPI Pt returns for f/u of diabetes mellitus: DM type: Insulin-requiring type 2 Dx'ed: 2008.   Complications: DR Therapy: insulin since 2015.  GDM: never.  DKA: never.   Severe hypoglycemia: never.   Pancreatitis: never.   Other: she declined bariatric surgery; she declines multiple daily injections.  Ins declined NPH (vials and pens).   Interval history: she brings a record of her cbg's which I have reviewed today.  cbg's are in the low to mid-100's. pt states she feels well in general.  She takes just 10 units of Basaglar per day.   Past Medical History:  Diagnosis Date  . Allergy   . Anemia    history  . Bronchitis    Hx - uses inhaler prn  . Hypertension   . Type I (juvenile type) diabetes mellitus without mention of complication, not stated as uncontrolled    per patient type 2     Past Surgical History:  Procedure Laterality Date  . left eye surgery     at age 25yrs, weak muscle  . missed abortion     elective     Social History   Socioeconomic History  . Marital status: Single    Spouse name: Not on file  . Number of children: Not on file  . Years of education: Not on file  . Highest education level: Not on file  Occupational History  . Not on file  Tobacco Use  . Smoking status: Never Smoker  . Smokeless tobacco: Never Used  Substance and Sexual Activity  . Alcohol use: Yes    Alcohol/week: 1.0 standard drink    Types: 1 Glasses of wine per week    Comment: occasional  . Drug use: No  . Sexual activity: Yes    Birth control/protection: Post-menopausal  Other Topics Concern  . Not on file  Social History Narrative  . Not on file   Social Determinants of Health   Financial Resource Strain:   . Difficulty of Paying Living Expenses: Not on file  Food Insecurity:   . Worried About Charity fundraiser in the Last Year: Not on file  . Ran Out of Food  in the Last Year: Not on file  Transportation Needs:   . Lack of Transportation (Medical): Not on file  . Lack of Transportation (Non-Medical): Not on file  Physical Activity:   . Days of Exercise per Week: Not on file  . Minutes of Exercise per Session: Not on file  Stress:   . Feeling of Stress : Not on file  Social Connections:   . Frequency of Communication with Friends and Family: Not on file  . Frequency of Social Gatherings with Friends and Family: Not on file  . Attends Religious Services: Not on file  . Active Member of Clubs or Organizations: Not on file  . Attends Archivist Meetings: Not on file  . Marital Status: Not on file  Intimate Partner Violence:   . Fear of Current or Ex-Partner: Not on file  . Emotionally Abused: Not on file  . Physically Abused: Not on file  . Sexually Abused: Not on file    Current Outpatient Medications on File Prior to Visit  Medication Sig Dispense Refill  . albuterol (VENTOLIN HFA) 108 (90 Base) MCG/ACT inhaler Inhale 1-2 puffs into the lungs every 6 (six) hours as needed  for wheezing or shortness of breath. 1 g 0  . amLODipine (NORVASC) 5 MG tablet Take 1 tablet (5 mg total) by mouth daily. 30 tablet 1  . glucose blood (ONETOUCH VERIO) test strip 1 each by Other route 2 (two) times daily. as directed 180 strip 3  . losartan-hydrochlorothiazide (HYZAAR) 100-25 MG tablet Take 1 tablet by mouth daily. WILL ONLY PROVIDE 30 DAY SUPPLY. SEND FUTURE REFILLS TO NEW PCP 30 tablet 0  . losartan-hydrochlorothiazide (HYZAAR) 50-12.5 MG tablet TAKE 1 TABLET BY MOUTH EVERY DAY 30 tablet 1  . Omega-3 Fatty Acids (OMEGA 3 PO) Take 1 tablet by mouth.    Glory Rosebush DELICA LANCETS 23F MISC USE AS DIRECTED TWICE A DAY 100 each 8  . triamcinolone cream (KENALOG) 0.1 % APPLY 1 APPLICATION 3 (THREE) TIMES DAILY AS NEEDED FOR ITCHING OR RASH 45 g 4   No current facility-administered medications on file prior to visit.    No Known Allergies  Family  History  Problem Relation Age of Onset  . Colon cancer Neg Hx   . Esophageal cancer Neg Hx   . Rectal cancer Neg Hx   . Stomach cancer Neg Hx     BP (!) 142/80   Pulse 97   Ht 4\' 11"  (1.499 m)   Wt 220 lb (99.8 kg)   LMP 07/16/2012   SpO2 95%   BMI 44.43 kg/m    Review of Systems She denies hypoglycemia and nausea. She has lost 6 lbs since last ov here.     Objective:   Physical Exam VITAL SIGNS:  See vs page GENERAL: no distress Pulses: dorsalis pedis intact bilat.   MSK: no deformity of the feet CV: no leg edema Skin:  no ulcer on the feet.  normal color and temp on the feet.   Neuro: sensation is intact to touch on the feet.   Lab Results  Component Value Date   HGBA1C 7.1 (A) 02/26/2020        Assessment & Plan:  HTN: is noted today Type 2 DM: well-controlled.  She can d/c insulin, on a trial basis.   Patient Instructions  Your blood pressure is high today.  Please see a new primary care provider soon, to have it rechecked.   check your blood sugar twice a day.  vary the time of day when you check, between before the 3 meals, and at bedtime.  also check if you have symptoms of your blood sugar being too high or too low.  please keep a record of the readings and bring it to your next appointment here (or you can bring the meter itself).  You can write it on any piece of paper.  please call us sooner if your blood sugar goes below 70, or if you have a lot of readings over 200. I have sent a prescription to your pharmacy, to increase the Trulicity, and:  Please stop taking the [place].   Please come back for a follow-up appointment in 2 months.

## 2020-03-09 ENCOUNTER — Other Ambulatory Visit: Payer: Self-pay | Admitting: Endocrinology

## 2020-03-09 NOTE — Telephone Encounter (Signed)
Please review and advise.

## 2020-03-09 NOTE — Telephone Encounter (Signed)
Please forward refill request to pt's primary care provider.   

## 2020-03-15 ENCOUNTER — Telehealth: Payer: Self-pay

## 2020-03-15 NOTE — Telephone Encounter (Signed)
Ms. Davia called and wanted to know if the office is accepting new patients. Ms. Gayler was told that Laurance Flatten, FNP-BC is accepting new patients.  Ms. Cromie said that is fine and wanted to know if she could call back because she is getting ready to catch a flight and I told her yes.  She said she has [employer].

## 2020-04-01 ENCOUNTER — Telehealth: Payer: Self-pay | Admitting: General Practice

## 2020-04-01 NOTE — Telephone Encounter (Signed)
Lvm to call back to schedule new patient appointment.

## 2020-04-04 ENCOUNTER — Telehealth: Payer: Self-pay | Admitting: General Practice

## 2020-04-04 NOTE — Telephone Encounter (Signed)
LVM to call back to schedule new patient appointment. 

## 2020-04-15 ENCOUNTER — Other Ambulatory Visit: Payer: Self-pay | Admitting: Endocrinology

## 2020-04-15 NOTE — Telephone Encounter (Signed)
Please forward refill request to pt's primary care provider.   

## 2020-04-29 ENCOUNTER — Encounter (HOSPITAL_COMMUNITY): Payer: Self-pay

## 2020-04-29 ENCOUNTER — Other Ambulatory Visit: Payer: Self-pay

## 2020-04-29 ENCOUNTER — Ambulatory Visit: Payer: No Typology Code available for payment source | Admitting: Endocrinology

## 2020-04-29 ENCOUNTER — Ambulatory Visit (HOSPITAL_COMMUNITY)
Admission: EM | Admit: 2020-04-29 | Discharge: 2020-04-29 | Disposition: A | Discharge status: Home / Self Care | Payer: No Typology Code available for payment source | Attending: Emergency Medicine | Admitting: Emergency Medicine

## 2020-04-29 ENCOUNTER — Encounter: Payer: Self-pay | Admitting: Endocrinology

## 2020-04-29 VITALS — BP 200/120 | HR 82 | Ht 59.0 in | Wt 212.0 lb

## 2020-04-29 DIAGNOSIS — E1165 Type 2 diabetes mellitus with hyperglycemia: Secondary | ICD-10-CM

## 2020-04-29 DIAGNOSIS — I10 Essential (primary) hypertension: Secondary | ICD-10-CM | POA: Diagnosis present

## 2020-04-29 LAB — BASIC METABOLIC PANEL
Anion gap: 8 (ref 5–15)
BUN: 18 mg/dL (ref 8–23)
CO2: 26 mmol/L (ref 22–32)
Calcium: 9.8 mg/dL (ref 8.9–10.3)
Chloride: 103 mmol/L (ref 98–111)
Creatinine, Ser: 0.63 mg/dL (ref 0.44–1.00)
GFR, Estimated: >60 mL/min (ref >60)
Glucose, Bld: 127 mg/dL — ABNORMAL HIGH (ref 70–99)
Potassium: 4.3 mmol/L (ref 3.5–5.1)
Sodium: 137 mmol/L (ref 135–145)

## 2020-04-29 LAB — POCT GLYCOSYLATED HEMOGLOBIN (HGB A1C): Hemoglobin A1C: 6.4 % — AB (ref 4.0–5.6)

## 2020-04-29 MED ORDER — TRIAMCINOLONE ACETONIDE 0.1 % EX CREA: TOPICAL_CREAM | CUTANEOUS | 0 refills | Status: DC

## 2020-04-29 MED ORDER — LOSARTAN POTASSIUM-HCTZ 50-12.5 MG PO TABS: 1.0000 | ORAL_TABLET | Freq: Every day | ORAL | 0 refills | Status: DC

## 2020-04-29 MED ORDER — TRULICITY 3 MG/0.5ML ~~LOC~~ SOAJ: 3.0000 mg | SUBCUTANEOUS | 3 refills | Status: DC

## 2020-04-29 NOTE — ED Triage Notes (Signed)
Pt present elevated blood pressure. Pt states she went to seen her Diabetes doctor today and when they checked her BP it was elevated.

## 2020-04-29 NOTE — Patient Instructions (Addendum)
Your blood pressure is high today.  Please see a new primary care provider soon, to have it rechecked.   check your blood sugar twice a day.  vary the time of day when you check, between before the 3 meals, and at bedtime.  also check if you have symptoms of your blood sugar being too high or too low.  please keep a record of the readings and bring it to your next appointment here (or you can bring the meter itself).  You can write it on any piece of paper.  please call us sooner if your blood sugar goes below 70, or if you have a lot of readings over 200. I have sent a prescription to your pharmacy, to increase the Trulicity again Please come back for a follow-up appointment in 3-4 months.

## 2020-04-29 NOTE — Progress Notes (Signed)
Subjective:    Patient ID: Kaylee Lynn, female    DOB: Nov 03, 1960, 59 y.o.   MRN: [id number]  HPI Pt returns for f/u of diabetes mellitus: DM type: Insulin-requiring type 2 Dx'ed: 2008.   Complications: DR Therapy: Trulicity GDM: never.  DKA: never.   Severe hypoglycemia: never.   Pancreatitis: never.   Other: she declined bariatric surgery; she took insulin 2015-2021; she declined multiple daily injections.  Ins declined NPH (vials and pens); she took metformin 2014-2015, and stopped for unclear reason.   Interval history: no cbg record, but states cbg's vary from 90-130. pt states she feels well in general.  She occasionally takes 10 units of Basaglar. Past Medical History:  Diagnosis Date  . Allergy   . Anemia    history  . Bronchitis    Hx - uses inhaler prn  . Hypertension   . Type I (juvenile type) diabetes mellitus without mention of complication, not stated as uncontrolled    per patient type 2     Past Surgical History:  Procedure Laterality Date  . left eye surgery     at age 33yrs, weak muscle  . missed abortion     elective     Social History   Socioeconomic History  . Marital status: Single    Spouse name: Not on file  . Number of children: Not on file  . Years of education: Not on file  . Highest education level: Not on file  Occupational History  . Not on file  Tobacco Use  . Smoking status: Never Smoker  . Smokeless tobacco: Never Used  Substance and Sexual Activity  . Alcohol use: Yes    Alcohol/week: 1.0 standard drink    Types: 1 Glasses of wine per week    Comment: occasional  . Drug use: No  . Sexual activity: Yes    Birth control/protection: Post-menopausal  Other Topics Concern  . Not on file  Social History Narrative  . Not on file   Social Determinants of Health   Financial Resource Strain:   . Difficulty of Paying Living Expenses: Not on file  Food Insecurity:   . Worried About Charity fundraiser in the Last Year:  Not on file  . Ran Out of Food in the Last Year: Not on file  Transportation Needs:   . Lack of Transportation (Medical): Not on file  . Lack of Transportation (Non-Medical): Not on file  Physical Activity:   . Days of Exercise per Week: Not on file  . Minutes of Exercise per Session: Not on file  Stress:   . Feeling of Stress : Not on file  Social Connections:   . Frequency of Communication with Friends and Family: Not on file  . Frequency of Social Gatherings with Friends and Family: Not on file  . Attends Religious Services: Not on file  . Active Member of Clubs or Organizations: Not on file  . Attends Archivist Meetings: Not on file  . Marital Status: Not on file  Intimate Partner Violence:   . Fear of Current or Ex-Partner: Not on file  . Emotionally Abused: Not on file  . Physically Abused: Not on file  . Sexually Abused: Not on file    Current Outpatient Medications on File Prior to Visit  Medication Sig Dispense Refill  . albuterol (VENTOLIN HFA) 108 (90 Base) MCG/ACT inhaler Inhale 1-2 puffs into the lungs every 6 (six) hours as needed for wheezing or shortness of  breath. 1 g 0  . amLODipine (NORVASC) 5 MG tablet Take 1 tablet (5 mg total) by mouth daily. 30 tablet 1  . glucose blood (ONETOUCH VERIO) test strip 1 each by Other route 2 (two) times daily. as directed 180 strip 3  . Omega-3 Fatty Acids (OMEGA 3 PO) Take 1 tablet by mouth.    Glory Rosebush DELICA LANCETS 84O MISC USE AS DIRECTED TWICE A DAY 100 each 8  . triamcinolone cream (KENALOG) 0.1 % APPLY 1 APPLICATION 3 (THREE) TIMES DAILY AS NEEDED FOR ITCHING OR RASH 45 g 4  . BD PEN NEEDLE NANO 2ND GEN 32G X 4 MM MISC     . losartan-hydrochlorothiazide (HYZAAR) 100-25 MG tablet Take 1 tablet by mouth daily. WILL ONLY PROVIDE 30 DAY SUPPLY. SEND FUTURE REFILLS TO NEW PCP (Patient not taking: Reported on 04/29/2020) 30 tablet 0  . losartan-hydrochlorothiazide (HYZAAR) 50-12.5 MG tablet TAKE 1 TABLET BY MOUTH  EVERY DAY (Patient not taking: Reported on 04/29/2020) 30 tablet 1   No current facility-administered medications on file prior to visit.    No Known Allergies  Family History  Problem Relation Age of Onset  . Colon cancer Neg Hx   . Esophageal cancer Neg Hx   . Rectal cancer Neg Hx   . Stomach cancer Neg Hx     BP (!) 200/120   Pulse 82   Ht 4\' 11"  (1.499 m)   Wt 212 lb (96.2 kg)   LMP 07/16/2012   SpO2 98%   BMI 42.82 kg/m    Review of Systems She denies hypoglycemia and nausea    Objective:   Physical Exam VITAL SIGNS:  See vs page GENERAL: no distress Pulses: dorsalis pedis intact bilat.   MSK: no deformity of the feet CV: no leg edema Skin:  no ulcer on the feet, but the skin is dry  normal color and temp on the feet. Neuro: sensation is intact to touch on the feet   Lab Results  Component Value Date   CREATININE 0.65 07/08/2018   BUN 13 07/08/2018   NA 133 (L) 07/08/2018   K 4.4 07/08/2018   CL 97 07/08/2018   CO2 25 07/08/2018   Lab Results  Component Value Date   HGBA1C 6.4 (A) 04/29/2020       Assessment & Plan:  HTN: is noted today Type 2 DM: She would benefit from increased rx, if it can be done with a regimen that avoids or minimizes hypoglycemia.  Patient Instructions  Your blood pressure is high today.  Please see a new primary care provider soon, to have it rechecked.   check your blood sugar twice a day.  vary the time of day when you check, between before the 3 meals, and at bedtime.  also check if you have symptoms of your blood sugar being too high or too low.  please keep a record of the readings and bring it to your next appointment here (or you can bring the meter itself).  You can write it on any piece of paper.  please call us sooner if your blood sugar goes below 70, or if you have a lot of readings over 200. I have sent a prescription to your pharmacy, to increase the Trulicity again Please come back for a follow-up appointment in  3-4 months.

## 2020-04-29 NOTE — ED Provider Notes (Signed)
[facility]    CSN: [id number] Arrival date & time: 04/29/20  1007      History   Chief Complaint Chief Complaint  Patient presents with  . Hypertension    HPI Kaylee Lynn is a 59 y.o. female.   MARETA CHESNUT presents with concerns about her blood pressure. She was at her endocrinologist today and noted BP 200/100. She has been out of her medications for a few weeks. She is establishing with a new primary care provider and has an appointment 12/15. She had been on losartan in the past, 100mg , but states more recently was on 50mg , although is unsure why it was changed. She has been seen in this clinic in the past and was given amlodipine. She does not have any more of this either. States a few days ago she had a bloody nose. No other headache, dizziness, chest pain , shortness of breath , leg swelling. She has recently lost 35lbs and has decreased her ha1c from 14 to 6.9 through diet changes.    ROS per HPI, negative if not otherwise mentioned.      Past Medical History:  Diagnosis Date  . Allergy   . Anemia    history  . Bronchitis    Hx - uses inhaler prn  . Hypertension   . Type I (juvenile type) diabetes mellitus without mention of complication, not stated as uncontrolled    per patient type 2     Patient Active Problem List   Diagnosis Date Noted  . Wellness examination 11/19/2014  . Screening for cancer 09/22/2013  . Encounter for long-term (current) use of other medications 08/01/2012  . 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 ANEMIA 02/28/2010  . HAND PAIN, LEFT 02/28/2010  . Diabetes ([place]) 03/20/2007  . MORBID OBESITY 03/20/2007  . HYPERTENSION 03/20/2007  . ALLERGIC RHINITIS 03/20/2007  . ECZEMA 03/20/2007  . ARTHRALGIA 03/20/2007    Past Surgical History:  Procedure Laterality Date  . left eye surgery     at age 28yrs, weak muscle  . missed abortion     elective     OB History   No obstetric  history on file.      Home Medications    Prior to Admission medications   Medication Sig Start Date End Date Taking? Authorizing Provider  albuterol (VENTOLIN HFA) 108 (90 Base) MCG/ACT inhaler Inhale 1-2 puffs into the lungs every 6 (six) hours as needed for wheezing or shortness of breath. 11/19/19   Renato Shin, MD  amLODipine (NORVASC) 5 MG tablet Take 1 tablet (5 mg total) by mouth daily. 02/17/19   Vanessa Kick, MD  BD PEN NEEDLE NANO 2ND GEN 32G X 4 MM MISC  04/13/20   [provider]  Dulaglutide (TRULICITY) 3 QZ/3.0QT SOPN Inject 3 mg as directed once a week. 04/29/20   Renato Shin, MD  glucose blood [facility] VERIO) test strip 1 each by Other route 2 (two) times daily. as directed 11/19/19   Renato Shin, MD  losartan-hydrochlorothiazide (HYZAAR) 100-25 MG tablet Take 1 tablet by mouth daily. WILL ONLY PROVIDE 30 DAY SUPPLY. SEND FUTURE REFILLS TO NEW PCP Patient not taking: Reported on 04/29/2020 06/05/19   Renato Shin, MD  losartan-hydrochlorothiazide [facility]) 50-12.5 MG tablet Take 1 tablet by mouth daily. 04/29/20   Zigmund Gottron, NP  Omega-3 Fatty Acids (OMEGA 3 PO) Take 1 tablet by mouth.    [provider]  [facility] DELICA LANCETS 62U MISC  USE AS DIRECTED TWICE A DAY 10/23/17   Renato Shin, MD  triamcinolone cream (KENALOG) 0.1 % APPLY 1 APPLICATION 3 (THREE) TIMES DAILY AS NEEDED FOR ITCHING OR RASH 04/29/20   Zigmund Gottron, NP    Family History Family History  Problem Relation Age of Onset  . Colon cancer Neg Hx   . Esophageal cancer Neg Hx   . Rectal cancer Neg Hx   . Stomach cancer Neg Hx     Social History Social History   Tobacco Use  . Smoking status: Never Smoker  . Smokeless tobacco: Never Used  Substance Use Topics  . Alcohol use: Yes    Alcohol/week: 1.0 standard drink    Types: 1 Glasses of wine per week    Comment: occasional  . Drug use: No     Allergies   Patient has no known allergies.   Review of  Systems Review of Systems   Physical Exam Triage Vital Signs ED Triage Vitals  Enc Vitals Group     BP 04/29/20 1054 (!) 189/85     Pulse Rate 04/29/20 1054 82     Resp 04/29/20 1054 18     Temp 04/29/20 1054 97.8 F (36.6 C)     Temp Source 04/29/20 1054 Oral     SpO2 04/29/20 1054 100 %     Weight --      Height --      Head Circumference --      Peak Flow --      Pain Score 04/29/20 1055 0     Pain Loc --      Pain Edu? --      Excl. in [place]? --    No data found.  Updated Vital Signs BP (!) 189/85 (BP Location: Right Arm)   Pulse 82   Temp 97.8 F (36.6 C) (Oral)   Resp 18   LMP 07/16/2012   SpO2 100%   Visual Acuity Right Eye Distance:   Left Eye Distance:   Bilateral Distance:    Right Eye Near:   Left Eye Near:    Bilateral Near:     Physical Exam Constitutional:      General: She is not in acute distress.    Appearance: She is well-developed.  Cardiovascular:     Rate and Rhythm: Normal rate and regular rhythm.  Pulmonary:     Effort: Pulmonary effort is normal.     Breath sounds: Normal breath sounds.  Skin:    General: Skin is warm and dry.  Neurological:     Mental Status: She is alert and oriented to person, place, and time.      UC Treatments / Results  Labs (all labs ordered are listed, but only abnormal results are displayed) Labs Reviewed  BASIC METABOLIC PANEL    EKG   Radiology No results found.  Procedures Procedures (including critical care time)  Medications Ordered in UC Medications - No data to display  Initial Impression / Assessment and Plan / UC Course  I have reviewed the triage vital signs and the nursing notes.  Pertinent labs & imaging results that were available during my care of the patient were reviewed by me and considered in my medical decision making (see chart for details).    Bmp collected. hyzaar restarted today and follow up and monitoring discussed. Return precautions provided. Patient  verbalized understanding and agreeable to plan.   Final Clinical Impressions(s) / UC Diagnoses   Final diagnoses:  Hypertension,  unspecified type     Discharge Instructions     Congratulations on your A1C! Keep it up! I have refilled your blood pressure medication.  You can check your blood pressure 1-2 times a week as needed.  If consistently still over 180/100 please return.  Follow up with your primary care provider as scheduled.  Return for any other concerns.    ED Prescriptions    Medication Sig Dispense Auth. Provider   losartan-hydrochlorothiazide (HYZAAR) 50-12.5 MG tablet Take 1 tablet by mouth daily. 60 tablet Kylie Simmonds B, NP   triamcinolone cream (KENALOG) 0.1 % APPLY 1 APPLICATION 3 (THREE) TIMES DAILY AS NEEDED FOR ITCHING OR RASH 45 g Zigmund Gottron, NP     PDMP not reviewed this encounter.   Zigmund Gottron, NP 04/29/20 1242

## 2020-04-29 NOTE — Discharge Instructions (Signed)
Congratulations on your A1C! Keep it up! I have refilled your blood pressure medication.  You can check your blood pressure 1-2 times a week as needed.  If consistently still over 180/100 please return.  Follow up with your primary care provider as scheduled.  Return for any other concerns.

## 2020-06-04 ENCOUNTER — Ambulatory Visit (HOSPITAL_COMMUNITY)
Admission: EM | Admit: 2020-06-04 | Discharge: 2020-06-04 | Disposition: A | Discharge status: Home / Self Care | Payer: No Typology Code available for payment source | Attending: Physician Assistant | Admitting: Physician Assistant

## 2020-06-04 ENCOUNTER — Encounter (HOSPITAL_COMMUNITY): Payer: Self-pay | Admitting: Emergency Medicine

## 2020-06-04 ENCOUNTER — Other Ambulatory Visit: Payer: Self-pay

## 2020-06-04 DIAGNOSIS — J069 Acute upper respiratory infection, unspecified: Secondary | ICD-10-CM | POA: Insufficient documentation

## 2020-06-04 DIAGNOSIS — Z20822 Contact with and (suspected) exposure to covid-19: Secondary | ICD-10-CM | POA: Insufficient documentation

## 2020-06-04 MED ORDER — IPRATROPIUM BROMIDE 0.06 % NA SOLN: 2.0000 | Freq: Four times a day (QID) | NASAL | 0 refills | Status: AC

## 2020-06-04 MED ORDER — BENZONATATE 200 MG PO CAPS: 200.0000 mg | ORAL_CAPSULE | Freq: Three times a day (TID) | ORAL | 0 refills | Status: DC

## 2020-06-04 NOTE — ED Triage Notes (Signed)
Pt states that she has a cough, chills, runny nose. Pt states that she had the covid booster 06/01/2020. Pt states that when she coughs her chest feels tight.

## 2020-06-04 NOTE — ED Provider Notes (Signed)
[place]    CSN: [id number] Arrival date & time: 06/04/20  1243      History   Chief Complaint Chief Complaint  Patient presents with   Cough    HPI Kaylee Lynn is a 59 y.o. female.   59 year old female comes in for 4 day of URI symptoms. Had chills 24 hours after pfizer booster 06/01/2020, but resolved. However, started having cough, rhinorrhea, fatigue. Denies fever, body aches. Denies abdominal pain, nausea, vomiting, diarrhea. States feels weak. Loss of taste. Denies shortness of breath. Never smoker.      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      Home Medications    Prior to Admission medications   Medication Sig Start Date End Date Taking? Authorizing Provider  amLODipine (NORVASC) 5 MG tablet Take 1 tablet (5 mg total) by mouth daily. 02/17/19  Yes Vanessa Kick, MD  BD PEN NEEDLE NANO 2ND GEN 32G X 4 MM MISC  04/13/20  Yes [provider]  Dulaglutide (TRULICITY) 3 GB/1.5VV SOPN Inject 3 mg as directed once a week. 04/29/20  Yes Renato Shin, MD   glucose blood (ONETOUCH VERIO) test strip 1 each by Other route 2 (two) times daily. as directed 11/19/19  Yes Renato Shin, MD  losartan-hydrochlorothiazide (HYZAAR) 100-25 MG tablet Take 1 tablet by mouth daily. WILL ONLY PROVIDE 30 DAY SUPPLY. SEND FUTURE REFILLS TO NEW PCP 06/05/19  Yes Renato Shin, MD  losartan-hydrochlorothiazide [facility]) 50-12.5 MG tablet Take 1 tablet by mouth daily. 04/29/20  Yes Zigmund Gottron, NP  ONETOUCH DELICA LANCETS 61Y MISC USE AS DIRECTED TWICE A DAY 10/23/17  Yes Renato Shin, MD  triamcinolone cream (KENALOG) 0.1 % APPLY 1 APPLICATION 3 (THREE) TIMES DAILY AS NEEDED FOR ITCHING OR RASH 04/29/20  Yes Augusto Gamble B, NP  albuterol (VENTOLIN HFA) 108 (90 Base) MCG/ACT inhaler Inhale 1-2 puffs into the lungs every 6 (six) hours as needed for wheezing or shortness of breath. 11/19/19   Renato Shin, MD  benzonatate (TESSALON) 200 MG capsule Take 1 capsule (200 mg total) by mouth every 8 (eight) hours. 06/04/20   Tasia Catchings, Deanglo Hissong V, PA-C  ipratropium (ATROVENT) 0.06 % nasal spray Place 2 sprays into both nostrils 4 (four) times daily. 06/04/20   Tasia Catchings, Celinda Dethlefs V, PA-C  Omega-3 Fatty Acids (OMEGA 3 PO) Take 1 tablet by mouth.    [provider]    Family History Family History  Problem Relation Age  of Onset   Colon cancer Neg Hx    Esophageal cancer Neg Hx    Rectal cancer Neg Hx    Stomach cancer Neg Hx     Social History Social History   Tobacco Use   Smoking status: Never Smoker   Smokeless tobacco: Never Used  Substance Use Topics   Alcohol use: Yes    Alcohol/week: 1.0 standard drink    Types: 1 Glasses of wine per week    Comment: occasional   Drug use: No     Allergies   Patient has no known allergies.   Review of Systems Review of Systems  Reason unable to perform ROS: See HPI as above.     Physical Exam Triage Vital Signs ED Triage Vitals  Enc Vitals Group     BP 06/04/20 1439 (!) 191/78     Pulse Rate 06/04/20 1439 93      Resp --      Temp 06/04/20 1439 98 F (36.7 C)     Temp Source 06/04/20 1439 Oral     SpO2 06/04/20 1439 100 %     Weight --      Height --      Head Circumference --      Peak Flow --      Pain Score 06/04/20 1433 0     Pain Loc --      Pain Edu? --      Excl. in [place]? --    No data found.  Updated Vital Signs BP (!) 191/78 (BP Location: Right Arm)    Pulse 93    Temp 98 F (36.7 C) (Oral)    LMP 07/16/2012    SpO2 100%   Physical Exam Constitutional:      General: She is not in acute distress.    Appearance: Normal appearance. She is not ill-appearing, toxic-appearing or diaphoretic.  HENT:     Head: Normocephalic and atraumatic.     Mouth/Throat:     Mouth: Mucous membranes are moist.     Pharynx: Oropharynx is clear. Uvula midline.  Cardiovascular:     Rate and Rhythm: Normal rate and regular rhythm.     Heart sounds: Normal heart sounds. No murmur heard. No friction rub. No gallop.   Pulmonary:     Effort: Pulmonary effort is normal. No accessory muscle usage, prolonged expiration, respiratory distress or retractions.     Comments: End expiration wheezing in the periphery. Otherwise good air movement. No rhonchi or rales.  Musculoskeletal:     Cervical back: Normal range of motion and neck supple.  Neurological:     General: No focal deficit present.     Mental Status: She is alert and oriented to person, place, and time.     Comments: Patient with baseline binocular vision with conjugate gaze palsy. Otherwise no focal deficits. Normal coordination and gait      UC Treatments / Results  Labs (all labs ordered are listed, but only abnormal results are displayed) Labs Reviewed  SARS CORONAVIRUS 2 (TAT 6-24 HRS)    EKG   Radiology No results found.  Procedures Procedures (including critical care time)  Medications Ordered in UC Medications - No data to display  Initial Impression / Assessment and Plan / UC Course  I have reviewed the triage vital  signs and the nursing notes.  Pertinent labs & imaging results that were available during my care of the patient were reviewed by me and considered in my medical  decision making (see chart for details).    COVID PCR test ordered. Patient to quarantine until testing results return. No alarming signs on exam. End expiratory wheezing, otherwise clear. patient already with albuterol at home, discussed to start using as directed. other symptomatic treatment discussed.  Push fluids.  Return precautions given.  Patient expresses understanding and agrees to plan.  Final Clinical Impressions(s) / UC Diagnoses   Final diagnoses:  Viral URI with cough    ED Prescriptions    Medication Sig Dispense Auth. Provider   benzonatate (TESSALON) 200 MG capsule Take 1 capsule (200 mg total) by mouth every 8 (eight) hours. 21 capsule Jabri Blancett V, PA-C   ipratropium (ATROVENT) 0.06 % nasal spray Place 2 sprays into both nostrils 4 (four) times daily. 15 mL Ok Edwards, PA-C     PDMP not reviewed this encounter.   Ok Edwards, PA-C 06/04/20 1533

## 2020-06-04 NOTE — Discharge Instructions (Addendum)
COVID PCR testing ordered. I would like you to quarantine until testing results. Tessalon for cough. Albuterol 2 puffs every 4-6 hours as needed, please do 2 puffs prior to bedtime as well. Atrovent nasal spray as directed. Tylenol/motrin for pain and fever. Keep hydrated, urine should be clear to pale yellow in color. If experiencing shortness of breath, trouble breathing, go to the emergency department for further evaluation needed.

## 2020-06-05 LAB — SARS CORONAVIRUS 2 (TAT 6-24 HRS): SARS Coronavirus 2: NEGATIVE

## 2020-06-08 ENCOUNTER — Ambulatory Visit (HOSPITAL_COMMUNITY)
Admission: RE | Admit: 2020-06-08 | Discharge: 2020-06-08 | Disposition: A | Discharge status: Home / Self Care | Payer: No Typology Code available for payment source | Source: Ambulatory Visit | Attending: Family Medicine | Admitting: Family Medicine

## 2020-06-08 ENCOUNTER — Ambulatory Visit (INDEPENDENT_AMBULATORY_CARE_PROVIDER_SITE_OTHER): Payer: No Typology Code available for payment source

## 2020-06-08 ENCOUNTER — Encounter (HOSPITAL_COMMUNITY): Payer: Self-pay

## 2020-06-08 ENCOUNTER — Telehealth: Payer: No Typology Code available for payment source | Admitting: Family Medicine

## 2020-06-08 ENCOUNTER — Other Ambulatory Visit: Payer: Self-pay

## 2020-06-08 VITALS — BP 173/89 | HR 103 | Temp 98.5°F | Resp 19

## 2020-06-08 DIAGNOSIS — R509 Fever, unspecified: Secondary | ICD-10-CM

## 2020-06-08 DIAGNOSIS — R059 Cough, unspecified: Secondary | ICD-10-CM | POA: Diagnosis not present

## 2020-06-08 DIAGNOSIS — J189 Pneumonia, unspecified organism: Secondary | ICD-10-CM

## 2020-06-08 DIAGNOSIS — I10 Essential (primary) hypertension: Secondary | ICD-10-CM

## 2020-06-08 MED ORDER — HYDROCOD POLST-CPM POLST ER 10-8 MG/5ML PO SUER
5.0000 mL | Freq: Two times a day (BID) | ORAL | 0 refills | Status: DC | PRN
Start: 2020-06-08 — End: 2020-07-29

## 2020-06-08 MED ORDER — LOSARTAN POTASSIUM-HCTZ 100-25 MG PO TABS: 1.0000 | ORAL_TABLET | Freq: Every day | ORAL | 1 refills | Status: DC

## 2020-06-08 MED ORDER — LEVOFLOXACIN 750 MG PO TABS: 750.0000 mg | ORAL_TABLET | Freq: Every day | ORAL | 0 refills | Status: DC

## 2020-06-08 NOTE — ED Triage Notes (Signed)
Pt states she also needs refill on her blood pressure medication. She states she had a visit with a new PCP this morning but they could not see her due to sick symptoms.

## 2020-06-08 NOTE — ED Triage Notes (Signed)
Pt states she has been having a cough and runny nose since Saturday. Pt states she has been using the prescriptions as prescribed with no relief. She states she feels worse and the cough is worse at night.

## 2020-06-11 NOTE — ED Provider Notes (Signed)
Kaylee Lynn   [id number] 06/08/20 Arrival Time: 3785  ASSESSMENT & PLAN:  1. Cough   2. Pneumonia of right lower lobe due to infectious organism   3. Elevated blood pressure reading in office with diagnosis of hypertension     I have personally viewed the imaging studies ordered this visit. Subtle RLL PNA.  Declines COVID testing.  Meds ordered this encounter  Medications  . levofloxacin (LEVAQUIN) 750 MG tablet    Sig: Take 1 tablet (750 mg total) by mouth daily.    Dispense:  7 tablet    Refill:  0  . chlorpheniramine-HYDROcodone (TUSSIONEX PENNKINETIC ER) 10-8 MG/5ML SUER    Sig: Take 5 mLs by mouth every 12 (twelve) hours as needed for cough.    Dispense:  60 mL    Refill:  0  . losartan-hydrochlorothiazide (HYZAAR) 100-25 MG tablet    Sig: Take 1 tablet by mouth daily.    Dispense:  30 tablet    Refill:  1     Follow-up Information    [place].   Specialty: Emergency Medicine Why: If symptoms worsen in any way. Contact information: [address] [id number] [place] [phone]              Also to plan PCP f/u to recheck BP.  Reviewed expectations re: course of current medical issues. Questions answered. Outlined signs and symptoms indicating need for more acute intervention. Understanding verbalized. After Visit Summary given.   SUBJECTIVE: History from: patient. Kaylee Lynn is a 59 y.o. female who reports persistent coughing over past week. No fever but has felt chilled. Cough affecting sleep. No SOB. No COVID exp. Normal PO intake without n/v/d.  Increased blood pressure noted today. Reports that she is treated for HTN. Needs refill on medication.    OBJECTIVE:  Vitals:   06/08/20 1623  BP: (!) 173/89  Pulse: (!) 103  Resp: 19  Temp: 98.5 F (36.9 C)  TempSrc: Oral  SpO2: 100%    BP and mild tachycardia noted.  General appearance:  alert; no distress Eyes: PERRLA; EOMI; conjunctiva normal HENT: [place]; AT; with nasal congestion Neck: supple  Lungs: speaks full sentences without difficulty; unlabored; coarse breath sounds bilaterally Extremities: no edema Skin: warm and dry Neurologic: normal gait Psychological: alert and cooperative; normal mood and affect  Labs: Results for orders placed or performed during the hospital encounter of 06/04/20  SARS CORONAVIRUS 2 (TAT 6-24 HRS) Nasopharyngeal Nasopharyngeal Swab   Specimen: Nasopharyngeal Swab  Result Value Ref Range   SARS Coronavirus 2 NEGATIVE NEGATIVE   Labs Reviewed - No data to display  Imaging: DG Chest 2 View  Result Date: 06/08/2020 CLINICAL DATA:  Fever, cough. EXAM: CHEST - 2 VIEW COMPARISON:  Chest radiograph 04/23/2014. FINDINGS: Right lower lung airspace opacities. No visible pleural effusions or pneumothorax. Cardiomediastinal silhouette is within normal limits. No acute osseous abnormality. IMPRESSION: Right lower lung airspace opacities, concerning for pneumonia. Electronically Signed   By: Margaretha Sheffield MD   On: 06/08/2020 17:05    No Known Allergies  Past Medical History:  Diagnosis Date  . Allergy   . Anemia    history  . Bronchitis    Hx - uses inhaler prn  . Hypertension   . Type I (juvenile type) diabetes mellitus without mention of complication, not stated as uncontrolled    per patient type 2    Social History   Socioeconomic History  .  Marital status: Single    Spouse name: Not on file  . Number of children: Not on file  . Years of education: Not on file  . Highest education level: Not on file  Occupational History  . Not on file  Tobacco Use  . Smoking status: Never Smoker  . Smokeless tobacco: Never Used  Substance and Sexual Activity  . Alcohol use: Yes    Alcohol/week: 1.0 standard drink    Types: 1 Glasses of wine per week    Comment: occasional  . Drug use: No  . Sexual activity: Yes    Birth  control/protection: Post-menopausal  Other Topics Concern  . Not on file  Social History Narrative  . Not on file   Social Determinants of Health   Financial Resource Strain: Not on file  Food Insecurity: Not on file  Transportation Needs: Not on file  Physical Activity: Not on file  Stress: Not on file  Social Connections: Not on file  Intimate Partner Violence: Not on file   Family History  Problem Relation Age of Onset  . Colon cancer Neg Hx   . Esophageal cancer Neg Hx   . Rectal cancer Neg Hx   . Stomach cancer Neg Hx    Past Surgical History:  Procedure Laterality Date  . left eye surgery     at age 36yrs, weak muscle  . missed abortion     elective      Vanessa Kick, MD 06/11/20 1030

## 2020-06-15 ENCOUNTER — Encounter (HOSPITAL_COMMUNITY): Payer: Self-pay

## 2020-06-15 ENCOUNTER — Other Ambulatory Visit: Payer: Self-pay

## 2020-06-15 ENCOUNTER — Ambulatory Visit (HOSPITAL_COMMUNITY)
Admission: EM | Admit: 2020-06-15 | Discharge: 2020-06-15 | Disposition: A | Discharge status: Home / Self Care | Payer: No Typology Code available for payment source | Attending: Family Medicine | Admitting: Family Medicine

## 2020-06-15 DIAGNOSIS — J189 Pneumonia, unspecified organism: Secondary | ICD-10-CM | POA: Insufficient documentation

## 2020-06-15 DIAGNOSIS — I10 Essential (primary) hypertension: Secondary | ICD-10-CM | POA: Diagnosis present

## 2020-06-15 DIAGNOSIS — Z20822 Contact with and (suspected) exposure to covid-19: Secondary | ICD-10-CM | POA: Insufficient documentation

## 2020-06-15 LAB — SARS CORONAVIRUS 2 (TAT 6-24 HRS): SARS Coronavirus 2: NEGATIVE

## 2020-06-15 MED ORDER — BENZONATATE 100 MG PO CAPS: ORAL_CAPSULE | ORAL | 0 refills | Status: DC

## 2020-06-15 NOTE — ED Provider Notes (Signed)
[place]   [id number] 06/15/20 Arrival Time: 0957  ASSESSMENT & PLAN:  1. Pneumonia of right lower lobe due to infectious organism   2. Elevated blood pressure reading in office with diagnosis of hypertension     Improving. Feels much better. Occas lingering cough. No SOB. No indication to re-image chest. Requests COVID testing. COVID-19 testing sent. See letter/work note on file for self-isolation guidelines. OTC symptom care as needed.  If needed: Meds ordered this encounter  Medications  . benzonatate (TESSALON) 100 MG capsule    Sig: Take 1 capsule by mouth every 8 (eight) hours for cough.    Dispense:  21 capsule    Refill:  0     Follow-up Information    [facility].   Specialty: Urgent Care Why: As needed. Contact information: [place] [phone]               Discharge Instructions     Your blood pressure was noted to be elevated during your visit today. If you are currently taking medication for high blood pressure, please ensure you are taking this as directed. If you do not have a history of high blood pressure and your blood pressure remains persistently elevated, you may need to begin taking a medication at some point. You may return here within the next few days to recheck if unable to see your primary care provider or if do not have a one.  BP (!) 198/90 (BP Location: Right Arm)   Pulse (!) 102   Temp 98 F (36.7 C) (Temporal)   Resp 18   LMP 07/16/2012   SpO2 98%   You have been tested for COVID-19 today. If your test returns positive, you will receive a phone call from [facility] regarding your results. Negative test results are not called. Both positive and negative results area always visible on MyChart. If you do not have a MyChart account, sign up instructions are provided in your discharge papers. Please do not hesitate to contact us should you have  questions or concerns.     Reviewed expectations re: course of current medical issues. Questions answered. Outlined signs and symptoms indicating need for more acute intervention. Understanding verbalized. After Visit Summary given.   SUBJECTIVE: History from: patient. Kaylee Lynn is a 59 y.o. female who is here for f/u. I saw her last week; dx with RLL PNA. Much better. No SOB/CP. Denies: fever.Mild lingering cough. Normal PO intake without n/v/d. Increased blood pressure noted today. Reports that she is treated for HTN.   OBJECTIVE:  Vitals:   06/15/20 1114  BP: (!) 198/90  Pulse: (!) 102  Resp: 18  Temp: 98 F (36.7 C)  TempSrc: Temporal  SpO2: 98%    Recheck HR: 92 General appearance: alert; no distress Eyes: PERRLA; EOMI; conjunctiva normal HENT: [place]; AT; without nasal congestion Neck: supple  Lungs: speaks full sentences without difficulty; unlabored; slightly coarse breath sounds bilaterally Extremities: no edema Skin: warm and dry Neurologic: normal gait Psychological: alert and cooperative; normal mood and affect  Labs:  Labs Reviewed  SARS CORONAVIRUS 2 (TAT 6-24 HRS)     Not on File  Past Medical History:  Diagnosis Date  . Allergy   . Anemia    history  . Bronchitis    Hx - uses inhaler prn  . Hypertension   . Type I (juvenile type) diabetes mellitus without mention of complication, not stated  as uncontrolled    per patient type 2    Social History   Socioeconomic History  . Marital status: Single    Spouse name: Not on file  . Number of children: Not on file  . Years of education: Not on file  . Highest education level: Not on file  Occupational History  . Not on file  Tobacco Use  . Smoking status: Never Smoker  . Smokeless tobacco: Never Used  Substance and Sexual Activity  . Alcohol use: Yes    Alcohol/week: 1.0 standard drink    Types: 1 Glasses of wine per week    Comment: occasional  . Drug use: No  . Sexual  activity: Yes    Birth control/protection: Post-menopausal  Other Topics Concern  . Not on file  Social History Narrative  . Not on file   Social Determinants of Health   Financial Resource Strain: Not on file  Food Insecurity: Not on file  Transportation Needs: Not on file  Physical Activity: Not on file  Stress: Not on file  Social Connections: Not on file  Intimate Partner Violence: Not on file   Family History  Problem Relation Age of Onset  . Colon cancer Neg Hx   . Esophageal cancer Neg Hx   . Rectal cancer Neg Hx   . Stomach cancer Neg Hx    Past Surgical History:  Procedure Laterality Date  . left eye surgery     at age 65yrs, weak muscle  . missed abortion     elective      Vanessa Kick, MD 06/15/20 1136

## 2020-06-15 NOTE — Discharge Instructions (Addendum)
Your blood pressure was noted to be elevated during your visit today. If you are currently taking medication for high blood pressure, please ensure you are taking this as directed. If you do not have a history of high blood pressure and your blood pressure remains persistently elevated, you may need to begin taking a medication at some point. You may return here within the next few days to recheck if unable to see your primary care provider or if do not have a one.  BP (!) 198/90 (BP Location: Right Arm)   Pulse (!) 102   Temp 98 F (36.7 C) (Temporal)   Resp 18   LMP 07/16/2012   SpO2 98%   You have been tested for COVID-19 today. If your test returns positive, you will receive a phone call from [facility] regarding your results. Negative test results are not called. Both positive and negative results area always visible on MyChart. If you do not have a MyChart account, sign up instructions are provided in your discharge papers. Please do not hesitate to contact us should you have questions or concerns.

## 2020-06-15 NOTE — ED Triage Notes (Signed)
Pt c/o chest pain. Pt states she had pneumonia and would like to get re-checked. Pt states she is still having a cough but not as bad as before. Pt denies fever.

## 2020-07-02 ENCOUNTER — Telehealth: Payer: Self-pay | Admitting: Family Medicine

## 2020-07-02 ENCOUNTER — Encounter: Payer: Self-pay | Admitting: Family Medicine

## 2020-07-02 NOTE — Telephone Encounter (Signed)
Pt was no show for appt 06/08/2020 for no show. 1st occurrence. Fee waived. Letter mailed.

## 2020-07-29 ENCOUNTER — Other Ambulatory Visit: Payer: Self-pay

## 2020-07-29 ENCOUNTER — Ambulatory Visit (INDEPENDENT_AMBULATORY_CARE_PROVIDER_SITE_OTHER): Payer: No Typology Code available for payment source | Admitting: Family

## 2020-07-29 ENCOUNTER — Encounter: Payer: Self-pay | Admitting: Family

## 2020-07-29 VITALS — BP 195/105 | HR 79 | Ht 58.5 in | Wt 201.0 lb

## 2020-07-29 DIAGNOSIS — I10 Essential (primary) hypertension: Secondary | ICD-10-CM | POA: Diagnosis not present

## 2020-07-29 DIAGNOSIS — Z7689 Persons encountering health services in other specified circumstances: Secondary | ICD-10-CM

## 2020-07-29 MED ORDER — LOSARTAN POTASSIUM-HCTZ 100-25 MG PO TABS
1.0000 | ORAL_TABLET | Freq: Every day | ORAL | 0 refills | Status: DC
Start: 2020-07-29 — End: 2020-09-07

## 2020-07-29 MED ORDER — AMLODIPINE BESYLATE 5 MG PO TABS: 5.0000 mg | ORAL_TABLET | Freq: Every day | ORAL | 0 refills | Status: DC

## 2020-07-29 NOTE — Progress Notes (Signed)
Establish care Needs refill on triamcinolone cream and losartan  Doesn't feel losartan is working BP readings always high

## 2020-07-29 NOTE — Progress Notes (Signed)
Subjective:    Kaylee Lynn - 60 y.o. female MRN [id number]  Date of birth: 1961/01/10  HPI  Kaylee Lynn is to establish care. Patient has a PMH significant for hypertension, allergic rhinitis, diabetes, morbid obesity, anemia, and arthralgia.    Current issues and/or concerns: 1. HYPERTENSION: 06/15/2020: Visit at [facility] with high blood pressure.   07/29/2020: Currently taking: see medication list Have you taken your blood pressure medication today: []  Yes [x]  No, reports she has been spacing Losartan-Hydrochlorothiazide to every other day so that it could last a little longer until she was seen in clinic for refills.   Med Adherence: []  Yes    [x]  No. Also, reports Amlodipine was discontinued recently and she is not sure why. States she did well taking Amlodipine without any side effects. Medication side effects: []  Yes    [x]  No Adherence with salt restriction (low-salt diet): [x]  Yes, also taking [employer] supplements which has assisted with her losing 50 pounds within the last year. Exercise: Yes [x]  No []  Home Monitoring?: [x]  Yes    []  No Monitoring Frequency: [x]  Yes    []  No Home BP results range: [x]  Yes    []  No 130s/80s-90s Smoking []  Yes [x]  No SOB? []  Yes    [x]  No Chest Pain?: []  Yes    [x]  No Leg swelling?: []  Yes    [x]  No Headaches?: []  Yes    [x]  No Dizziness? [x]  No  ROS per HPI   Health Maintenance:  Health Maintenance Due  Topic Date Due  . PAP SMEAR-Modifier  Never done  . MAMMOGRAM  02/29/2012  . OPHTHALMOLOGY EXAM  07/10/2019  . INFLUENZA VACCINE  01/24/2020  . COVID-19 Vaccine (3 - Booster for Pfizer series) 03/18/2020     Past Medical History: Patient Active Problem List   Diagnosis Date Noted  . Wellness examination 11/19/2014  . Screening for cancer 09/22/2013  . Encounter for long-term (current) use of other medications 08/01/2012  . 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 ANEMIA 02/28/2010  . HAND PAIN, LEFT 02/28/2010  . Diabetes ([place]) 03/20/2007  . MORBID OBESITY 03/20/2007  . Essential hypertension 03/20/2007  . ALLERGIC RHINITIS 03/20/2007  . ECZEMA 03/20/2007  . ARTHRALGIA 03/20/2007    Social History   reports that she has never smoked. She has never used smokeless tobacco. She reports current alcohol use of about 1.0 standard drink of alcohol per week. She reports that she does not use drugs.   Family History  family history is not on file.   Medications: reviewed and updated   Objective:   Physical Exam BP (!) 195/105 (BP Location: Left Arm, Patient Position: Sitting)   Pulse 79   Ht 4' 10.5" (1.486 m)   Wt 201 lb (91.2 kg)   LMP 07/16/2012   SpO2 98%   BMI 41.29 kg/m    Wt Readings from Last 3 Encounters:  07/29/20 201 lb (91.2 kg)  04/29/20 212 lb (96.2 kg)  02/26/20 220 lb (99.8 kg)    Physical Exam HENT:     Head: Normocephalic.  Eyes:     Extraocular Movements: Extraocular movements intact.     Pupils: Pupils are equal, round, and reactive to light.  Cardiovascular:     Rate and Rhythm: Normal rate and regular rhythm.     Pulses: Normal pulses.     Heart sounds: Normal heart sounds.  Pulmonary:     Effort: Pulmonary  effort is normal.     Breath sounds: Normal breath sounds.  Musculoskeletal:     Cervical back: Normal range of motion and neck supple.  Neurological:     General: No focal deficit present.     Mental Status: She is alert and oriented to person, place, and time.  Psychiatric:        Mood and Affect: Mood normal.        Assessment & Plan:  1. Encounter to establish care: - Patient presents today to establish care.  - Return for annual physical examination, labs, and health maintenance. Arrive fasting meaning having had no food and/or nothing to drink for at least 8 hours prior to appointment.  2. Essential hypertension: - Blood pressure not at goal during today's visit. Patient asymptomatic  without chest pressure, chest pain, palpitations, shortness of breath, and worst headache of life. - Patient reports she has not consistently been taking high blood pressure medications because she was trying to extend the medication to last until she could be seen in office for refills. States she has been taking medications every other day. Did not have medications today as of yet.  - Continue Losartan-Hydrochlorothiazide as prescribed. - Resume taking Amlodipine as prescribed. - Counseled on blood pressure goal of less than 130/80, low-sodium, DASH diet, medication compliance, 150 minutes of moderate intensity exercise per week as tolerated. Discussed medication compliance, adverse effects. - Follow-up within 2 weeks for blood pressure check. Write down your blood pressure readings each day and bring those results along with your home blood pressure monitor to your appointment. - losartan-hydrochlorothiazide (HYZAAR) 100-25 MG tablet; Take 1 tablet by mouth daily.  Dispense: 90 tablet; Refill: 0 - amLODipine (NORVASC) 5 MG tablet; Take 1 tablet (5 mg total) by mouth daily.  Dispense: 45 tablet; Refill: 0  Durene Fruits, NP 07/31/2020, 7:16 AM Primary Care at [facility]

## 2020-07-29 NOTE — Patient Instructions (Signed)
Return in 4 to 6 weeks or sooner if needed for annual physical examination, labs, and health maintenance. Arrive fasting meaning having had no food and/or nothing to drink for at least 8 hours prior to appointment.  Continue Losartan-Hydrochlorothiazide for high blood pressure.   Begin Amlodipine for high blood pressure.   Counseled on blood pressure goal of less than 130/80, low-sodium, DASH diet, medication compliance, 150 minutes of moderate intensity exercise per week as tolerated. Discussed medication compliance, adverse effects. Thank you for choosing Primary Care at [facility] for your medical home!    Kaylee Lynn was seen by Camillia Herter, NP today.   Kaylee Lynn's primary care provider is Camillia Herter, NP.   For the best care possible,  you should try to see Durene Fruits, NP whenever you come to clinic.   We look forward to seeing you again soon!  If you have any questions about your visit today,  please call us at [phone]  Or feel free to reach your provider via [facility].   Hypertension, Adult Hypertension is another name for high blood pressure. High blood pressure forces your heart to work harder to pump blood. This can cause problems over time. There are two numbers in a blood pressure reading. There is a top number (systolic) over a bottom number (diastolic). It is best to have a blood pressure that is below 120/80. Healthy choices can help lower your blood pressure, or you may need medicine to help lower it. What are the causes? The cause of this condition is not known. Some conditions may be related to high blood pressure. What increases the risk?  Smoking.  Having type 2 diabetes mellitus, high cholesterol, or both.  Not getting enough exercise or physical activity.  Being overweight.  Having too much fat, sugar, calories, or salt (sodium) in your diet.  Drinking too much alcohol.  Having long-term (chronic) kidney disease.  Having  a family history of high blood pressure.  Age. Risk increases with age.  Race. You may be at higher risk if you are African American.  Gender. Men are at higher risk than women before age 65. After age 5, women are at higher risk than men.  Having obstructive sleep apnea.  Stress. What are the signs or symptoms?  High blood pressure may not cause symptoms. Very high blood pressure (hypertensive crisis) may cause: ? Headache. ? Feelings of worry or nervousness (anxiety). ? Shortness of breath. ? Nosebleed. ? A feeling of being sick to your stomach (nausea). ? Throwing up (vomiting). ? Changes in how you see. ? Very bad chest pain. ? Seizures. How is this treated?  This condition is treated by making healthy lifestyle changes, such as: ? Eating healthy foods. ? Exercising more. ? Drinking less alcohol.  Your health care provider may prescribe medicine if lifestyle changes are not enough to get your blood pressure under control, and if: ? Your top number is above 130. ? Your bottom number is above 80.  Your personal target blood pressure may vary. Follow these instructions at home: Eating and drinking  If told, follow the DASH eating plan. To follow this plan: ? Fill one half of your plate at each meal with fruits and vegetables. ? Fill one fourth of your plate at each meal with whole grains. Whole grains include whole-wheat pasta, brown rice, and whole-grain bread. ? Eat or drink low-fat dairy products, such as skim milk or low-fat yogurt. ? Fill one  fourth of your plate at each meal with low-fat (lean) proteins. Low-fat proteins include fish, chicken without skin, eggs, beans, and tofu. ? Avoid fatty meat, cured and processed meat, or chicken with skin. ? Avoid pre-made or processed food.  Eat less than 1,500 mg of salt each day.  Do not drink alcohol if: ? Your doctor tells you not to drink. ? You are pregnant, may be pregnant, or are planning to become  pregnant.  If you drink alcohol: ? Limit how much you use to:  0-1 drink a day for women.  0-2 drinks a day for men. ? Be aware of how much alcohol is in your drink. In the [place] one drink equals one 12 oz bottle of beer (355 mL), one 5 oz glass of wine (148 mL), or one 1 oz glass of hard liquor (44 mL).   Lifestyle  Work with your doctor to stay at a healthy weight or to lose weight. Ask your doctor what the best weight is for you.  Get at least 30 minutes of exercise most days of the week. This may include walking, swimming, or biking.  Get at least 30 minutes of exercise that strengthens your muscles (resistance exercise) at least 3 days a week. This may include lifting weights or doing Pilates.  Do not use any products that contain nicotine or tobacco, such as cigarettes, e-cigarettes, and chewing tobacco. If you need help quitting, ask your doctor.  Check your blood pressure at home as told by your doctor.  Keep all follow-up visits as told by your doctor. This is important.   Medicines  Take over-the-counter and prescription medicines only as told by your doctor. Follow directions carefully.  Do not skip doses of blood pressure medicine. The medicine does not work as well if you skip doses. Skipping doses also puts you at risk for problems.  Ask your doctor about side effects or reactions to medicines that you should watch for. Contact a doctor if you:  Think you are having a reaction to the medicine you are taking.  Have headaches that keep coming back (recurring).  Feel dizzy.  Have swelling in your ankles.  Have trouble with your vision. Get help right away if you:  Get a very bad headache.  Start to feel mixed up (confused).  Feel weak or numb.  Feel faint.  Have very bad pain in your: ? Chest. ? Belly (abdomen).  Throw up more than once.  Have trouble breathing. Summary  Hypertension is another name for high blood pressure.  High blood pressure  forces your heart to work harder to pump blood.  For most people, a normal blood pressure is less than 120/80.  Making healthy choices can help lower blood pressure. If your blood pressure does not get lower with healthy choices, you may need to take medicine. This information is not intended to replace advice given to you by your health care provider. Make sure you discuss any questions you have with your health care provider. Document Revised: 02/19/2018 Document Reviewed: 02/19/2018 Elsevier Patient Education  2021 [employer].

## 2020-08-04 ENCOUNTER — Other Ambulatory Visit: Payer: Self-pay

## 2020-08-04 ENCOUNTER — Telehealth: Payer: No Typology Code available for payment source | Admitting: Endocrinology

## 2020-08-11 ENCOUNTER — Ambulatory Visit
Admission: EM | Admit: 2020-08-11 | Discharge: 2020-08-11 | Disposition: A | Discharge status: Home / Self Care | Payer: No Typology Code available for payment source | Attending: Family Medicine | Admitting: Family Medicine

## 2020-08-11 ENCOUNTER — Other Ambulatory Visit: Payer: Self-pay

## 2020-08-11 DIAGNOSIS — I10 Essential (primary) hypertension: Secondary | ICD-10-CM

## 2020-08-11 DIAGNOSIS — H9201 Otalgia, right ear: Secondary | ICD-10-CM

## 2020-08-11 DIAGNOSIS — H8110 Benign paroxysmal vertigo, unspecified ear: Secondary | ICD-10-CM

## 2020-08-11 MED ORDER — MECLIZINE HCL 25 MG PO TABS: 25.0000 mg | ORAL_TABLET | Freq: Three times a day (TID) | ORAL | 0 refills | Status: DC | PRN

## 2020-08-11 MED ORDER — ONDANSETRON 4 MG PO TBDP: 4.0000 mg | ORAL_TABLET | Freq: Three times a day (TID) | ORAL | 0 refills | Status: DC | PRN

## 2020-08-11 NOTE — ED Provider Notes (Addendum)
Kaylee Lynn   [id number] 08/11/20 Arrival Time: 1237  ASSESSMENT & PLAN:  1. Benign paroxysmal positional vertigo, unspecified laterality   2. Acute otalgia, right   3. Elevated blood pressure reading in office with diagnosis of hypertension     Normal neurologic exam. No suspicion for ICH or SAH. No indication for urgent neurodiagnostic imaging at this time. Discussed.  Meds ordered this encounter  Medications  . meclizine (ANTIVERT) 25 MG tablet    Sig: Take 1 tablet (25 mg total) by mouth 3 (three) times daily as needed for dizziness.    Dispense:  30 tablet    Refill:  0  . ondansetron (ZOFRAN-ODT) 4 MG disintegrating tablet    Sig: Take 1 tablet (4 mg total) by mouth every 8 (eight) hours as needed for nausea or vomiting.    Dispense:  15 tablet    Refill:  0    Reassured that these symptoms do not appear to represent a serious or threatening condition. This is generally a self-limited temporary but uncomfortable situation. Rest, avoid potentially dangerous activities (such as driving or working with machinery or at heights). Use OTC Meclizine prn. Will proceed to the ED if he develops other symptoms such as alterations of speech, swallowing, vision, motor/sensory systems, or if dizziness worsens.    Discharge Instructions      Your blood pressure was noted to be elevated during your visit today. If you are currently taking medication for high blood pressure, please ensure you are taking this as directed. If you do not have a history of high blood pressure and your blood pressure remains persistently elevated, you may need to begin taking a medication at some point. You may return here within the next few days to recheck if unable to see your primary care provider or if you do not have a one.  BP (!) 193/83 (BP Location: Left Arm)   Pulse 74   Temp (!) 97.4 F (36.3 C) (Oral)   Resp 20   LMP 07/16/2012   SpO2 98%   BP Readings from Last 3 Encounters:   08/11/20 (!) 193/83  07/29/20 (!) 195/105  06/15/20 (!) 198/90        Follow-up Information    Camillia Herter, NP.   Specialty: Nurse Practitioner Why: As needed. Contact information: [place] [address] [phone]        [facility] .   Specialty: Urgent Care Why: If worsening or failing to improve as anticipated. Contact information: [address] [place] [id number] [place].   Why: If worsening or failing to improve as anticipated. Contact information: [place] [address] [phone]               Reviewed expectations re: course of current medical issues. Questions answered. Outlined signs and symptoms indicating need for more acute intervention. Patient verbalized understanding. After Visit Summary given.   SUBJECTIVE:  Kaylee Lynn is a 60 y.o. female who reports abrupt onset of dizziness described as vertigo. Current symptoms first noted today and have progressed to a point and plateaued. Experiencing symptoms frequently with episodes typically lasting until she can still herself. Aggravating factors: head movements and bending. Denies headaches, paresthesia, speech problems, tremors and weakness as well as aural pressure and otalgia. Recent infections: none. Head trauma: denied. Noise exposure:  none. No associated SOB, CP, or palpatations reported. Recent travel: none. Reports normal bowel/bladder habits. Previous workup/treatments: none. H/O similar: in very distant past; resolved without tx. Therapies tried thus far: none.  Social History   Substance and Sexual Activity  Alcohol Use Yes  . Alcohol/week: 1.0 standard drink  . Types: 1 Glasses of wine per week   Comment: occasional   Social History   Tobacco Use  Smoking Status Never Smoker   Smokeless Tobacco Never Used   Denies illegal drug use.  Increased blood pressure noted today. Reports that she is treated for HTN. She reports taking medications as instructed, no chest pain on exertion, no dyspnea on exertion, no swelling of ankles and no palpitations.   OBJECTIVE:  Vitals:   08/11/20 1339 08/11/20 1413  BP: (!) 193/83   Pulse: 74   Resp: 20   Temp: (!) 97.4 F (36.3 C)   TempSrc: Oral   SpO2: 98% 98%    General appearance: alert; no distress Eyes: PERRLA; EOMI; conjunctiva normal HENT: normocephalic; atraumatic; TMs normal; nasal mucosa normal; oral mucosa normal Neck: supple with FROM Lungs: clear to auscultation bilaterally Heart: regular rate and rhythm Abdomen: soft, non-tender; bowel sounds normal Extremities: no cyanosis or edema; symmetrical with no gross deformities Skin: warm and dry Neurologic: normal gait; DTR's normal and symmetric; CN 2-12 grossly intact; rapid changes in position during the exam do precipitate brief dizziness without observed nystagmus. Psychological: alert and cooperative; normal mood and affect   No Known Allergies  Past Medical History:  Diagnosis Date  . Allergy   . Anemia    history  . Bronchitis    Hx - uses inhaler prn  . Hypertension   . Type I (juvenile type) diabetes mellitus without mention of complication, not stated as uncontrolled    per patient type 2    Social History   Socioeconomic History  . Marital status: Single    Spouse name: Not on file  . Number of children: Not on file  . Years of education: Not on file  . Highest education level: Not on file  Occupational History  . Not on file  Tobacco Use  . Smoking status: Never Smoker  . Smokeless tobacco: Never Used  Vaping Use  . Vaping Use: Never used  Substance and Sexual Activity  . Alcohol use: Yes    Alcohol/week: 1.0 standard drink    Types: 1 Glasses of wine per week    Comment: occasional  . Drug use: No  . Sexual activity:  Yes    Birth control/protection: Post-menopausal  Other Topics Concern  . Not on file  Social History Narrative  . Not on file   Social Determinants of Health   Financial Resource Strain: Not on file  Food Insecurity: Not on file  Transportation Needs: Not on file  Physical Activity: Not on file  Stress: Not on file  Social Connections: Not on file  Intimate Partner Violence: Not on file   Family History  Problem Relation Age of Onset  . Colon cancer Neg Hx   . Esophageal cancer Neg Hx   . Rectal cancer Neg Hx   . Stomach cancer Neg Hx    Past Surgical History:  Procedure Laterality Date  . left eye surgery     at age 46yrs, weak muscle  . missed abortion     elective       Vanessa Kick, MD 08/11/20 1504    Vanessa Kick, MD 08/11/20 [phone]

## 2020-08-11 NOTE — ED Triage Notes (Signed)
Patient complains of right ear pain and fullness that came on suddenly with dizziness and nauseated feeling. Pt states she had vertigo years ago and this feels similar to that. P{t is aox4 and ambulates but states she feels unsteady on her feet.

## 2020-08-11 NOTE — Discharge Instructions (Addendum)
Your blood pressure was noted to be elevated during your visit today. If you are currently taking medication for high blood pressure, please ensure you are taking this as directed. If you do not have a history of high blood pressure and your blood pressure remains persistently elevated, you may need to begin taking a medication at some point. You may return here within the next few days to recheck if unable to see your primary care provider or if you do not have a one.  BP (!) 193/83 (BP Location: Left Arm)   Pulse 74   Temp (!) 97.4 F (36.3 C) (Oral)   Resp 20   LMP 07/16/2012   SpO2 98%   BP Readings from Last 3 Encounters:  08/11/20 (!) 193/83  07/29/20 (!) 195/105  06/15/20 (!) 198/90

## 2020-08-23 ENCOUNTER — Other Ambulatory Visit: Payer: Self-pay

## 2020-08-23 ENCOUNTER — Ambulatory Visit: Payer: No Typology Code available for payment source | Admitting: Family

## 2020-08-23 ENCOUNTER — Encounter: Payer: Self-pay | Admitting: Family

## 2020-08-23 VITALS — BP 178/86 | HR 85 | Wt 204.6 lb

## 2020-08-23 DIAGNOSIS — L309 Dermatitis, unspecified: Secondary | ICD-10-CM

## 2020-08-23 DIAGNOSIS — I10 Essential (primary) hypertension: Secondary | ICD-10-CM

## 2020-08-23 DIAGNOSIS — H9201 Otalgia, right ear: Secondary | ICD-10-CM

## 2020-08-23 DIAGNOSIS — H8110 Benign paroxysmal vertigo, unspecified ear: Secondary | ICD-10-CM

## 2020-08-23 MED ORDER — MECLIZINE HCL 25 MG PO TABS: 25.0000 mg | ORAL_TABLET | Freq: Three times a day (TID) | ORAL | 0 refills | Status: DC | PRN

## 2020-08-23 MED ORDER — TRIAMCINOLONE ACETONIDE 0.1 % EX CREA: TOPICAL_CREAM | CUTANEOUS | 0 refills | Status: DC

## 2020-08-23 MED ORDER — AMLODIPINE BESYLATE 10 MG PO TABS: 10.0000 mg | ORAL_TABLET | Freq: Every day | ORAL | 0 refills | Status: DC

## 2020-08-23 NOTE — Progress Notes (Signed)
Triamcinolone refilled.

## 2020-08-23 NOTE — Progress Notes (Signed)
Patient ID: AALEYAH WITHEROW, female    DOB: August 23, 1960  MRN: [id number]  CC: Urgent Care Follow-Up  Subjective: Kaylee Lynn is a 60 y.o. female who presents for urgent care follow-up.  Visit 08/11/2020 at [facility] at [facility] per MD note: Reports abrupt onset of dizziness described as vertigo. Current symptoms first noted today and have progressed to a point and plateaued. Experiencing symptoms frequently with episodes typically lasting until she can still herself. Aggravating factors: head movements and bending. Denies headaches, paresthesia, speech problems, tremors and weakness as well as aural pressure and otalgia. Recent infections: none. Head trauma: denied. Noise exposure: none. No associated SOB, CP, or palpatations reported. Recent travel: none. Reports normal bowel/bladder habits. Previous workup/treatments: none. H/O similar: in very distant past; resolved without tx. Therapies tried thus far: none.  Assessment and Plan: 1. Benign paroxysmal positional vertigo, unspecified laterality 2. Acute otalgia right 3. Elevated blood pressure reading in office with diagnosis of hypertension  Normal neurologic exam. No suspicion for ICH or SAH. No indication for urgent neurodiagnostic imaging at this time. Discussed.  Reassured that these symptoms do not appear to represent a serious or threatening condition. This is generally a self-limited temporary but uncomfortable situation. Rest, avoid potentially dangerous activities (such as driving or working with machinery or at heights). Use OTC Meclizine prn. Will proceed to the ED if he develops other symptoms such as alterations of speech, swallowing, vision, motor/sensory systems, or if dizziness worsens.  Discharge Instructions: Your blood pressure was noted to be elevated during your visit today. If you are currently taking medication for high blood pressure, please ensure you are taking this as directed. If you  do not have a history of high blood pressure and your blood pressure remains persistently elevated, you may need to begin taking a medication at some point. You may return here within the next few days to recheck if unable to see your primary care provider or if you do not have a one. Referral to ENT.  08/23/2020: Time since discharge: 12 days Hospital/facility: [facility] Diagnosis:  1. Benign paroxysmal positional vertigo, unspecified laterality 2. Acute otalgia right 3. Elevated blood pressure reading in office with diagnosis of hypertension Procedures/tests: none Consultants: none New medications: Meclizine HCl, Ondansetron  Status: better No nausea since the first day vertigo began, no longer using Ondansetron. Vertigo began to improve 2 days ago. Can now hear out of the right ear. Right ear no longer ringing. States she is feeling well today. Did have similar vertigo experience 8 years ago. Requesting refills on Meclizine.  2. HYPERTENSION FOLLOW-UP: 07/29/2020: - Patient reports she has not consistently been taking high blood pressure medications because she was trying to extend the medication to last until she could be seen in office for refills. States she has been taking medications every other day. Did not have medications today as of yet.  - Continue Losartan-Hydrochlorothiazide as prescribed. - Resume taking Amlodipine as prescribed.  08/23/2020:  Currently taking: see medication list Med Adherence: [x]  Yes    []  No Medication side effects: []  Yes    [x]  No Adherence with salt restriction (low-salt diet): [x]  Yes    []  No Exercise: Yes [x]  No []  Reports blood pressure continuing to run high even with taking medications as prescribed. Feeling well during today's visit.  Patient Active Problem List   Diagnosis Date Noted  . Wellness examination 11/19/2014  . Screening for cancer 09/22/2013  .  Encounter for long-term (current) use of other  medications 08/01/2012  . 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 ANEMIA 02/28/2010  . HAND PAIN, LEFT 02/28/2010  . Diabetes ([place]) 03/20/2007  . MORBID OBESITY 03/20/2007  . Essential hypertension 03/20/2007  . ALLERGIC RHINITIS 03/20/2007  . ECZEMA 03/20/2007  . ARTHRALGIA 03/20/2007     Current Outpatient Medications on File Prior to Visit  Medication Sig Dispense Refill  . albuterol (VENTOLIN HFA) 108 (90 Base) MCG/ACT inhaler Inhale 1-2 puffs into the lungs every 6 (six) hours as needed for wheezing or shortness of breath. 1 g 0  . BD PEN NEEDLE NANO 2ND GEN 32G X 4 MM MISC     . Dulaglutide (TRULICITY) 3 OZ/3.0QM SOPN Inject 3 mg as directed once a week. 6 mL 3  . glucose blood (ONETOUCH VERIO) test strip 1 each by Other route 2 (two) times daily. as directed 180 strip 3  . ipratropium (ATROVENT) 0.06 % nasal spray Place 2 sprays into both nostrils 4 (four) times daily. 15 mL 0  . losartan-hydrochlorothiazide (HYZAAR) 100-25 MG tablet Take 1 tablet by mouth daily. 90 tablet 0  . Omega-3 Fatty Acids (OMEGA 3 PO) Take 1 tablet by mouth.    . ondansetron (ZOFRAN-ODT) 4 MG disintegrating tablet Take 1 tablet (4 mg total) by mouth every 8 (eight) hours as needed for nausea or vomiting. 15 tablet 0  . ONETOUCH DELICA LANCETS 57Q MISC USE AS DIRECTED TWICE A DAY 100 each 8   No current facility-administered medications on file prior to visit.    No Known Allergies  Social History   Socioeconomic History  . Marital status: Single    Spouse name: Not on file  . Number of children: Not on file  . Years of education: Not on file  . Highest education level: Not on file  Occupational History  . Not on file  Tobacco Use  . Smoking status: Never Smoker  . Smokeless tobacco: Never Used  Vaping Use  . Vaping Use: Never used  Substance and Sexual Activity  . Alcohol use: Yes    Alcohol/week: 1.0 standard drink    Types: 1 Glasses of wine per week     Comment: occasional  . Drug use: No  . Sexual activity: Yes    Birth control/protection: Post-menopausal  Other Topics Concern  . Not on file  Social History Narrative  . Not on file   Social Determinants of Health   Financial Resource Strain: Not on file  Food Insecurity: Not on file  Transportation Needs: Not on file  Physical Activity: Not on file  Stress: Not on file  Social Connections: Not on file  Intimate Partner Violence: Not on file    Family History  Problem Relation Age of Onset  . Colon cancer Neg Hx   . Esophageal cancer Neg Hx   . Rectal cancer Neg Hx   . Stomach cancer Neg Hx     Past Surgical History:  Procedure Laterality Date  . left eye surgery     at age 21yrs, weak muscle  . missed abortion     elective     ROS: Review of Systems Negative except as stated above  PHYSICAL EXAM: BP (!) 178/86   Pulse 85   Wt 204 lb 9.6 oz (92.8 kg)   LMP 07/16/2012   SpO2 95%   BMI 42.03 kg/m   Physical Exam Constitutional:      Appearance: She is obese.  HENT:     Head: Normocephalic and atraumatic.     Right Ear: Tympanic membrane, ear canal and external ear normal.     Left Ear: Tympanic membrane, ear canal and external ear normal.  Eyes:     Extraocular Movements: Extraocular movements intact.     Pupils: Pupils are equal, round, and reactive to light.  Cardiovascular:     Rate and Rhythm: Normal rate and regular rhythm.     Pulses: Normal pulses.     Heart sounds: Normal heart sounds.  Pulmonary:     Effort: Pulmonary effort is normal.     Breath sounds: Normal breath sounds.  Musculoskeletal:     Cervical back: Normal range of motion and neck supple.  Skin:    Findings: Rash present.     Comments: Mild eczema bilateral dorsal aspect of hands.  Neurological:     General: No focal deficit present.     Mental Status: She is alert and oriented to person, place, and time.  Psychiatric:        Mood and Affect: Mood normal.        Behavior:  Behavior normal.    ASSESSMENT AND PLAN: 1. Benign paroxysmal positional vertigo, unspecified laterality: - Resolved. - Meclizine as prescribed. Ordered so that patient may have a prescription available should a recurrence happen. - Follow-up with primary provider as needed.  - meclizine (ANTIVERT) 25 MG tablet; Take 1 tablet (25 mg total) by mouth 3 (three) times daily as needed for dizziness.  Dispense: 30 tablet; Refill: 0  2. Acute otalgia, right: - Resolved.  3. Essential hypertension: - Blood pressure not at goal during today's visit. Patient asymptomatic without chest pressure, chest pain, palpitations, shortness of breath, and worst headache of life. - Continue Losartan-Hydrochlorothiazide 100-25 mg daily as prescribed. - Increase Amlodipine from 5 mg daily to 10 mg daily.  - Counseled on blood pressure goal of less than 130/80, low-sodium, DASH diet, medication compliance, 150 minutes of moderate intensity exercise per week as tolerated. Discussed medication compliance, adverse effects. - Follow-up with primary provider within 2 weeks or sooner if needed.  - amLODipine (NORVASC) 10 MG tablet; Take 1 tablet (10 mg total) by mouth daily.  Dispense: 30 tablet; Refill: 0  4. Eczema, unspecified type: - Continue Triamcinolone as prescribed. - triamcinolone (KENALOG) 0.1 %; APPLY 1 APPLICATION 3 (THREE) TIMES DAILY AS NEEDED FOR ITCHING OR RASH  Dispense: 80 g; Refill: 0  Patient was given the opportunity to ask questions.  Patient verbalized understanding of the plan and was able to repeat key elements of the plan. Patient was given clear instructions to go to Emergency Department or return to medical center if symptoms don't improve, worsen, or new problems develop.The patient verbalized understanding.  Requested Prescriptions   Signed Prescriptions Disp Refills  . amLODipine (NORVASC) 10 MG tablet 30 tablet 0    Sig: Take 1 tablet (10 mg total) by mouth daily.  Marland Kitchen triamcinolone  (KENALOG) 0.1 % 80 g 0    Sig: APPLY 1 APPLICATION 3 (THREE) TIMES DAILY AS NEEDED FOR ITCHING OR RASH  . meclizine (ANTIVERT) 25 MG tablet 30 tablet 0    Sig: Take 1 tablet (25 mg total) by mouth 3 (three) times daily as needed for dizziness.    Return in about 1 week (around 08/30/2020) for Telephone Visit, Follow-Up hypertension.  Camillia Herter, NP

## 2020-08-23 NOTE — Patient Instructions (Signed)
Refill Meclizine for vertigo.  Increase Amlodipine for high blood pressure.   Continue Losartan-Hydrochlorothiazide for high blood pressure.   Follow-up within 1 - 2 weeks or sooner if needed for high blood pressure.  Refill Triamcinolone for eczema.  Vertigo Vertigo is the feeling that you or the things around you are moving when they are not. This feeling can come and go at any time. Vertigo often goes away on its own. This condition can be dangerous if it happens when you are doing activities like driving or working with machines. Your doctor will do tests to find the cause of your vertigo. These tests will also help your doctor decide on the best treatment for you. Follow these instructions at home: Eating and drinking  Drink enough fluid to keep your pee (urine) pale yellow.  Do not drink alcohol.      Activity  Return to your normal activities as told by your doctor. Ask your doctor what activities are safe for you.  In the morning, first sit up on the side of the bed. When you feel okay, stand slowly while you hold onto something until you know that your balance is fine.  Move slowly. Avoid sudden body or head movements or certain positions, as told by your doctor.  Use a cane if you have trouble standing or walking.  Sit down right away if you feel dizzy.  Avoid doing any tasks or activities that can cause danger to you or others if you get dizzy.  Avoid bending down if you feel dizzy. Place items in your home so that they are easy for you to reach without leaning over.  Do not drive or use heavy machinery if you feel dizzy. General instructions  Take over-the-counter and prescription medicines only as told by your doctor.  Keep all follow-up visits as told by your doctor. This is important. Contact a doctor if:  Your medicine does not help your vertigo.  You have a fever.  Your problems get worse or you have new symptoms.  Your family or friends see  changes in your behavior.  The feeling of being sick to your stomach gets worse.  Your vomiting gets worse.  You lose feeling (have numbness) in part of your body.  You feel prickling and tingling in a part of your body. Get help right away if:  You have trouble moving or talking.  You are always dizzy.  You pass out (faint).  You get very bad headaches.  You feel weak in your hands, arms, or legs.  You have changes in your hearing.  You have changes in how you see (vision).  You get a stiff neck.  Bright light starts to bother you. Summary  Vertigo is the feeling that you or the things around you are moving when they are not.  Your doctor will do tests to find the cause of your vertigo.  You may be told to avoid some tasks, positions, or movements.  Contact a doctor if your medicine is not helping, or if you have a fever, new symptoms, or a change in behavior.  Get help right away if you get very bad headaches, or if you have changes in how you speak, hear, or see. This information is not intended to replace advice given to you by your health care provider. Make sure you discuss any questions you have with your health care provider. Document Revised: 05/05/2018 Document Reviewed: 05/05/2018 Elsevier Patient Education  2021 [employer].

## 2020-08-23 NOTE — Progress Notes (Signed)
Vertigo f/u Ringing in right ear  Needs triamcinolone refill

## 2020-08-26 ENCOUNTER — Encounter: Payer: No Typology Code available for payment source | Admitting: Family

## 2020-09-04 ENCOUNTER — Other Ambulatory Visit: Payer: Self-pay | Admitting: Family

## 2020-09-04 DIAGNOSIS — I10 Essential (primary) hypertension: Secondary | ICD-10-CM

## 2020-09-07 ENCOUNTER — Other Ambulatory Visit: Payer: Self-pay

## 2020-09-07 ENCOUNTER — Encounter: Payer: Self-pay | Admitting: Family

## 2020-09-07 ENCOUNTER — Ambulatory Visit (INDEPENDENT_AMBULATORY_CARE_PROVIDER_SITE_OTHER): Payer: No Typology Code available for payment source | Admitting: Family

## 2020-09-07 VITALS — BP 183/99 | HR 83 | Ht 58.5 in | Wt 203.0 lb

## 2020-09-07 DIAGNOSIS — Z1329 Encounter for screening for other suspected endocrine disorder: Secondary | ICD-10-CM

## 2020-09-07 DIAGNOSIS — Z13220 Encounter for screening for lipoid disorders: Secondary | ICD-10-CM | POA: Diagnosis not present

## 2020-09-07 DIAGNOSIS — Z13228 Encounter for screening for other metabolic disorders: Secondary | ICD-10-CM | POA: Diagnosis not present

## 2020-09-07 DIAGNOSIS — Z124 Encounter for screening for malignant neoplasm of cervix: Secondary | ICD-10-CM

## 2020-09-07 DIAGNOSIS — Z130 Encounter for screening for diseases of the blood and blood-forming organs and certain disorders involving the immune mechanism: Secondary | ICD-10-CM | POA: Diagnosis not present

## 2020-09-07 DIAGNOSIS — E119 Type 2 diabetes mellitus without complications: Secondary | ICD-10-CM

## 2020-09-07 DIAGNOSIS — Z0100 Encounter for examination of eyes and vision without abnormal findings: Secondary | ICD-10-CM

## 2020-09-07 DIAGNOSIS — Z0000 Encounter for general adult medical examination without abnormal findings: Secondary | ICD-10-CM

## 2020-09-07 DIAGNOSIS — I10 Essential (primary) hypertension: Secondary | ICD-10-CM

## 2020-09-07 DIAGNOSIS — Z1231 Encounter for screening mammogram for malignant neoplasm of breast: Secondary | ICD-10-CM

## 2020-09-07 MED ORDER — AMLODIPINE BESYLATE 10 MG PO TABS: 10.0000 mg | ORAL_TABLET | Freq: Every day | ORAL | 0 refills | Status: DC

## 2020-09-07 MED ORDER — LOSARTAN POTASSIUM-HCTZ 100-25 MG PO TABS: 1.0000 | ORAL_TABLET | Freq: Every day | ORAL | 0 refills | Status: DC

## 2020-09-07 NOTE — Progress Notes (Signed)
Physical

## 2020-09-07 NOTE — Progress Notes (Signed)
Patient ID: AME HEAGLE, female    DOB: May 01, 1961  MRN: [id number]  CC: Annual Physical Exam  Subjective: Kaylee Lynn is a 60 y.o. female who presents for annual physical exam. Her concerns today include:   1. HYPERTENSION FOLLOW-UP: 08/23/2020: - Continue Losartan-Hydrochlorothiazide 100-25 mg daily as prescribed. - Increase Amlodipine from 5 mg daily to 10 mg daily.  09/07/2020: Currently taking: see medication list Have you taken your blood pressure medication today: [x]  Yes []  No  Med Adherence: [x]  Yes    []  No Medication side effects: []  Yes    [x]  No Adherence with salt restriction (low-salt diet): [x]  Yes    []  No Exercise: Yes [x]  No []  Home Monitoring?: [x]  Yes    []  No Monitoring Frequency: [x]  Yes    []  No Home BP results range: [x]  Yes, 160's-190's/90's Smoking []  Yes [x]  No SOB? []  Yes    [x]  No Chest Pain?: []  Yes    [x]  No Leg swelling?: []  Yes    [x]  No Headaches?: []  Yes    [x]  No Dizziness? []  Yes    [x]  No  2. DIABETES TYPE 2 FOLLOW-UP:  Reports still taking Trulicity and managed by Endocrinology with an appointment scheduled soon. Declines hemoglobin A1c testing today in anticipation of Endocrinology appointment.  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  1 g 0   BD PEN NEEDLE NANO 2ND GEN 32G X 4 MM MISC      Dulaglutide (TRULICITY) 3 WU/9.8JX SOPN Inject 3 mg as directed once a week. 6 mL 3   glucose blood (ONETOUCH VERIO) test strip 1 each by Other route 2 (two) times daily. as directed 180 strip 3   ipratropium (ATROVENT) 0.06 % nasal spray Place 2 sprays into both nostrils 4 (four) times daily. 15 mL 0   meclizine (ANTIVERT) 25 MG tablet Take 1 tablet (25 mg total) by mouth 3 (three) times daily as needed for dizziness. 30 tablet 0   Omega-3 Fatty Acids (OMEGA 3 PO) Take 1 tablet by mouth.     ondansetron (ZOFRAN-ODT) 4 MG disintegrating tablet Take 1 tablet (4 mg total) by mouth every 8 (eight) hours as needed for nausea or vomiting. 15 tablet 0   ONETOUCH DELICA LANCETS 91Y MISC USE AS DIRECTED TWICE A DAY 100 each 8   triamcinolone (KENALOG) 0.1 % APPLY 1 APPLICATION 3 (THREE) TIMES DAILY AS NEEDED FOR ITCHING OR RASH 80 g 0   No current facility-administered medications on file prior to visit.    No Known Allergies  Social History   Socioeconomic History   Marital status: Single    Spouse name: Not on file   Number of children: Not on file   Years of education: Not on file   Highest education level: Not on file  Occupational History   Not on file  Tobacco Use   Smoking status: Never Smoker   Smokeless tobacco: Never Used  Vaping Use   Vaping Use: Never used  Substance and Sexual Activity   Alcohol use: Yes    Alcohol/week: 1.0 standard drink    Types: 1 Glasses of wine per week    Comment: occasional   Drug use: No   Sexual activity: Yes    Birth control/protection: Post-menopausal  Other Topics Concern   Not on file  Social History Narrative   Not on file   Social Determinants of Health   Financial Resource Strain: Not on file  Food Insecurity: Not on file  Transportation Needs: Not on file  Physical Activity: Not on file  Stress: Not on file  Social Connections: Not on file  Intimate  Partner Violence: Not on file    Family History  Problem Relation Age of Onset   Colon cancer Neg Hx    Esophageal cancer Neg Hx    Rectal cancer Neg Hx    Stomach cancer Neg Hx     Past Surgical History:  Procedure Laterality Date   left eye surgery     at age 8yrs, weak muscle   missed abortion     elective     ROS: Review of Systems Negative except as stated above  PHYSICAL EXAM: BP (!) 183/99 (BP Location: Left Arm, Patient Position: Sitting)    Pulse 83    Ht 4' 10.5" (1.486 m)    Wt 203 lb (92.1 kg)    LMP 07/16/2012    SpO2 100%    BMI 41.70 kg/m    Wt Readings from Last 3 Encounters:  09/07/20 203 lb (92.1 kg)  08/23/20 204 lb 9.6 oz (92.8 kg)  07/29/20 201 lb (91.2 kg)    Physical Exam Exam conducted with a chaperone present.  Constitutional:      Appearance: She is obese.  HENT:     Head: Normocephalic and atraumatic.     Right Ear: Tympanic membrane, ear canal and external ear normal.     Left Ear: Tympanic membrane, ear canal and external ear normal.     Nose: Nose normal.     Mouth/Throat:     Mouth: Mucous membranes are moist.     Pharynx: Oropharynx is clear.  Eyes:     Extraocular Movements: Extraocular movements intact.     Conjunctiva/sclera: Conjunctivae normal.     Pupils: Pupils are equal, round, and reactive to light.  Cardiovascular:     Rate and Rhythm: Normal rate and regular rhythm.     Pulses: Normal pulses.     Heart sounds: Normal heart sounds.  Pulmonary:     Effort: Pulmonary effort is normal.     Breath sounds: Normal breath sounds.  Chest:  Breasts:     Right: Normal.     Left: Normal.      Comments: Elmon Else, CMA present during examination.  Abdominal:     General: Bowel sounds are normal.  Genitourinary:    Comments: Patient declined examination.  Musculoskeletal:        General: Normal range of motion.     Cervical back: Normal range of motion and neck supple.  Skin:    General: Skin is warm and  dry.     Capillary Refill: Capillary refill takes less than 2 seconds.  Neurological:     General: No focal deficit present.     Mental Status: She  is alert and oriented to person, place, and time.  Psychiatric:        Mood and Affect: Mood normal.        Behavior: Behavior normal.    ASSESSMENT AND PLAN: 1. Annual physical exam: - Counseled on 150 minutes of exercise per week as tolerated, healthy eating (including decreased daily intake of saturated fats, cholesterol, added sugars, sodium), STI prevention, and routine healthcare maintenance.  2. Screening for metabolic disorder: - CMP to check kidney function, liver function, and electrolyte balance.  - Comprehensive metabolic panel  3. Screening for deficiency anemia: - CBC to screen for anemia. - CBC  4. Screening cholesterol level: - Lipid panel to screen for high cholesterol.  - Lipid panel  5. Thyroid disorder screen: - TSH to check thyroid function.  - TSH+T4F+T3Free  6. Encounter for screening mammogram for malignant neoplasm of breast: - Referral for breast cancer screening by mammogram. - MM Digital Screening; Future  7. Cervical cancer screening: - Per patient request referral to Gynecology for cervical cancer screening by PAP smear.  - Ambulatory referral to Gynecology  8. Diabetic eye exam [facility]): - Patient declined referral as she already has an eye doctor which she sees regularly.  9. Essential hypertension: - Blood pressure not at goal during today's visit. Patient asymptomatic without chest pressure, chest pain, palpitations, shortness of breath, and worst headache of life. - Patient endorses taking blood pressure medications as prescribed. - Continue Losartan-Hydrochlorothiazide and Amlodipine as prescribed.  - Referral to [facility] for further evaluation and management.  - Follow-up with primary provider as scheduled.  - Ambulatory referral to [facility] - [facility]  [place] - losartan-hydrochlorothiazide (HYZAAR) 100-25 MG tablet; Take 1 tablet by mouth daily.  Dispense: 90 tablet; Refill: 0 - amLODipine (NORVASC) 10 MG tablet; Take 1 tablet (10 mg total) by mouth daily.  Dispense: 90 tablet; Refill: 0  10. Type 2 diabetes mellitus without complication, unspecified whether long term insulin use ([place]): - Continue Dulaglutide (Trulicity) as prescribed. - Keep appointment with Endocrinology scheduled 09/12/2020 and to have hemoglobin A1c checked at that time.     Patient was given the opportunity to ask questions.  Patient verbalized understanding of the plan and was able to repeat key elements of the plan. Patient was given clear instructions to go to Emergency Department or return to medical center if symptoms don't improve, worsen, or new problems develop.The patient verbalized understanding.   Orders Placed This Encounter  Procedures   MM Digital Screening   Comprehensive metabolic panel   CBC   Lipid panel   TSH+T4F+T3Free   Ambulatory referral to Gynecology   Ambulatory referral to [facility] - [facility] [place]     Requested Prescriptions   Signed Prescriptions Disp Refills   losartan-hydrochlorothiazide (HYZAAR) 100-25 MG tablet 90 tablet 0    Sig: Take 1 tablet by mouth daily.   amLODipine (NORVASC) 10 MG tablet 90 tablet 0    Sig: Take 1 tablet (10 mg total) by mouth daily.    Follow-up with primary provider as scheduled.   Camillia Herter, NP

## 2020-09-07 NOTE — Patient Instructions (Addendum)
Annual physical exam and labs today.   Continue Lisinopril-Hydrochlorothiazide and Amlodipine for high blood pressure. Referral to Hypertension Clinic for further evaluation and management.  Keep appointment with Endocrinology for diabetes management.   Referral to Gynecology for PAP smear.  Referral for breast cancer screening.   Follow-up with primary provider as scheduled.  Preventive Care 37-62 Years Old, Female Preventive care refers to lifestyle choices and visits with your health care provider that can promote health and wellness. This includes:  A yearly physical exam. This is also called an annual wellness visit.  Regular dental and eye exams.  Immunizations.  Screening for certain conditions.  Healthy lifestyle choices, such as: ? Eating a healthy diet. ? Getting regular exercise. ? Not using drugs or products that contain nicotine and tobacco. ? Limiting alcohol use. What can I expect for my preventive care visit? Physical exam Your health care provider will check your:  Height and weight. These may be used to calculate your BMI (body mass index). BMI is a measurement that tells if you are at a healthy weight.  Heart rate and blood pressure.  Body temperature.  Skin for abnormal spots. Counseling Your health care provider may ask you questions about your:  Past medical problems.  Family's medical history.  Alcohol, tobacco, and drug use.  Emotional well-being.  Home life and relationship well-being.  Sexual activity.  Diet, exercise, and sleep habits.  Work and work Statistician.  Access to firearms.  Method of birth control.  Menstrual cycle.  Pregnancy history. What immunizations do I need? Vaccines are usually given at various ages, according to a schedule. Your health care provider will recommend vaccines for you based on your age, medical history, and lifestyle or other factors, such as travel or where you work.   What tests do I  need? Blood tests  Lipid and cholesterol levels. These may be checked every 5 years, or more often if you are over 77 years old.  Hepatitis C test.  Hepatitis B test. Screening  Lung cancer screening. You may have this screening every year starting at age 30 if you have a 30-pack-year history of smoking and currently smoke or have quit within the past 15 years.  Colorectal cancer screening. ? All adults should have this screening starting at age 70 and continuing until age 28. ? Your health care provider may recommend screening at age 80 if you are at increased risk. ? You will have tests every 1-10 years, depending on your results and the type of screening test.  Diabetes screening. ? This is done by checking your blood sugar (glucose) after you have not eaten for a while (fasting). ? You may have this done every 1-3 years.  Mammogram. ? This may be done every 1-2 years. ? Talk with your health care provider about when you should start having regular mammograms. This may depend on whether you have a family history of breast cancer.  BRCA-related cancer screening. This may be done if you have a family history of breast, ovarian, tubal, or peritoneal cancers.  Pelvic exam and Pap test. ? This may be done every 3 years starting at age 60. ? Starting at age 68, this may be done every 5 years if you have a Pap test in combination with an HPV test. Other tests  STD (sexually transmitted disease) testing, if you are at risk.  Bone density scan. This is done to screen for osteoporosis. You may have this scan if you are  at high risk for osteoporosis. Talk with your health care provider about your test results, treatment options, and if necessary, the need for more tests. Follow these instructions at home: Eating and drinking  Eat a diet that includes fresh fruits and vegetables, whole grains, lean protein, and low-fat dairy products.  Take vitamin and mineral supplements as  recommended by your health care provider.  Do not drink alcohol if: ? Your health care provider tells you not to drink. ? You are pregnant, may be pregnant, or are planning to become pregnant.  If you drink alcohol: ? Limit how much you have to 0-1 drink a day. ? Be aware of how much alcohol is in your drink. In the [place] one drink equals one 12 oz bottle of beer (355 mL), one 5 oz glass of wine (148 mL), or one 1 oz glass of hard liquor (44 mL).   Lifestyle  Take daily care of your teeth and gums. Brush your teeth every morning and night with fluoride toothpaste. Floss one time each day.  Stay active. Exercise for at least 30 minutes 5 or more days each week.  Do not use any products that contain nicotine or tobacco, such as cigarettes, e-cigarettes, and chewing tobacco. If you need help quitting, ask your health care provider.  Do not use drugs.  If you are sexually active, practice safe sex. Use a condom or other form of protection to prevent STIs (sexually transmitted infections).  If you do not wish to become pregnant, use a form of birth control. If you plan to become pregnant, see your health care provider for a prepregnancy visit.  If told by your health care provider, take low-dose aspirin daily starting at age 59.  Find healthy ways to cope with stress, such as: ? Meditation, yoga, or listening to music. ? Journaling. ? Talking to a trusted person. ? Spending time with friends and family. Safety  Always wear your seat belt while driving or riding in a vehicle.  Do not drive: ? If you have been drinking alcohol. Do not ride with someone who has been drinking. ? When you are tired or distracted. ? While texting.  Wear a helmet and other protective equipment during sports activities.  If you have firearms in your house, make sure you follow all gun safety procedures. What's next?  Visit your health care provider once a year for an annual wellness visit.  Ask your  health care provider how often you should have your eyes and teeth checked.  Stay up to date on all vaccines. This information is not intended to replace advice given to you by your health care provider. Make sure you discuss any questions you have with your health care provider. Document Revised: 03/15/2020 Document Reviewed: 02/20/2018 Elsevier Patient Education  2021 [employer].

## 2020-09-12 ENCOUNTER — Ambulatory Visit: Payer: No Typology Code available for payment source | Admitting: Endocrinology

## 2020-09-12 ENCOUNTER — Other Ambulatory Visit: Payer: Self-pay

## 2020-09-12 VITALS — BP 170/80 | HR 90 | Ht 59.0 in | Wt 203.6 lb

## 2020-09-12 DIAGNOSIS — E1165 Type 2 diabetes mellitus with hyperglycemia: Secondary | ICD-10-CM

## 2020-09-12 LAB — POCT GLYCOSYLATED HEMOGLOBIN (HGB A1C): Hemoglobin A1C: 6.4 % — AB (ref 4.0–5.6)

## 2020-09-12 NOTE — Progress Notes (Signed)
Subjective:    Patient ID: Kaylee Lynn, female    DOB: 09-30-1960, 60 y.o.   MRN: [id number]  HPI Pt returns for f/u of diabetes mellitus:  DM type: 2 Dx'ed: 2008.   Complications: DR Therapy: Trulicity GDM: never.  DKA: never.   Severe hypoglycemia: never.   Pancreatitis: never.   Other: she declined bariatric surgery; she took insulin 2015-2021; she declined multiple daily injections.  Ins declined NPH (vials and pens); she took metformin 2014-2015, and stopped for unclear reason.   Interval history: no cbg record, but states cbg's vary from 70-145. pt states she feels well in general, except for a sensation at the plantar aspect of the right forefoot.   Past Medical History:  Diagnosis Date   Allergy    Anemia    history   Bronchitis    Hx - uses inhaler prn   Hypertension    Type I (juvenile type) diabetes mellitus without mention of complication, not stated as uncontrolled    per patient type 2     Past Surgical History:  Procedure Laterality Date   left eye surgery     at age 89yrs, weak muscle   missed abortion     elective     Social History   Socioeconomic History   Marital status: Single    Spouse name: Not on file   Number of children: Not on file   Years of education: Not on file   Highest education level: Not on file  Occupational History   Not on file  Tobacco Use   Smoking status: Never Smoker   Smokeless tobacco: Never Used  Vaping Use   Vaping Use: Never used  Substance and Sexual Activity   Alcohol use: Yes    Alcohol/week: 1.0 standard drink    Types: 1 Glasses of wine per week    Comment: occasional   Drug use: No   Sexual activity: Yes    Birth control/protection: Post-menopausal  Other Topics Concern   Not on file  Social History Narrative   Not on file   Social Determinants of Health   Financial Resource Strain: Not on file  Food Insecurity: Not on file  Transportation Needs: Not on file  Physical  Activity: Not on file  Stress: Not on file  Social Connections: Not on file  Intimate Partner Violence: Not on file    Current Outpatient Medications on File Prior to Visit  Medication Sig Dispense Refill   albuterol (VENTOLIN HFA) 108 (90 Base) MCG/ACT inhaler Inhale 1-2 puffs into the lungs every 6 (six) hours as needed for wheezing or shortness of breath. 1 g 0   amLODipine (NORVASC) 10 MG tablet Take 1 tablet (10 mg total) by mouth daily. 90 tablet 0   BD PEN NEEDLE NANO 2ND GEN 32G X 4 MM MISC      Dulaglutide (TRULICITY) 3 RD/4.0CX SOPN Inject 3 mg as directed once a week. 6 mL 3   glucose blood (ONETOUCH VERIO) test strip 1 each by Other route 2 (two) times daily. as directed 180 strip 3   ipratropium (ATROVENT) 0.06 % nasal spray Place 2 sprays into both nostrils 4 (four) times daily. 15 mL 0   losartan-hydrochlorothiazide (HYZAAR) 100-25 MG tablet Take 1 tablet by mouth daily. 90 tablet 0   meclizine (ANTIVERT) 25 MG tablet Take 1 tablet (25 mg total) by mouth 3 (three) times daily as needed for dizziness. 30 tablet 0   Omega-3 Fatty Acids (OMEGA 3  PO) Take 1 tablet by mouth.     ondansetron (ZOFRAN-ODT) 4 MG disintegrating tablet Take 1 tablet (4 mg total) by mouth every 8 (eight) hours as needed for nausea or vomiting. 15 tablet 0   ONETOUCH DELICA LANCETS 91Y MISC USE AS DIRECTED TWICE A DAY 100 each 8   triamcinolone (KENALOG) 0.1 % APPLY 1 APPLICATION 3 (THREE) TIMES DAILY AS NEEDED FOR ITCHING OR RASH 80 g 0   No current facility-administered medications on file prior to visit.    No Known Allergies  Family History  Problem Relation Age of Onset   Colon cancer Neg Hx    Esophageal cancer Neg Hx    Rectal cancer Neg Hx    Stomach cancer Neg Hx     BP (!) 170/80 (BP Location: Right Arm, Patient Position: Sitting, Cuff Size: Large)    Pulse 90    Ht 4\' 11"  (1.499 m)    Wt 203 lb 9.6 oz (92.4 kg)    LMP 07/16/2012    SpO2 93%    BMI 41.12 kg/m     Review of Systems Denies nausea.      Objective:   Physical Exam VITAL SIGNS:  See vs page GENERAL: no distress Pulses: dorsalis pedis intact bilat.   MSK: no deformity of the feet CV: no leg edema Skin:  no ulcer on the feet.  normal color and temp on the feet. Neuro: sensation is intact to touch on the feet   Lab Results  Component Value Date   HGBA1C 6.4 (A) 09/12/2020       Assessment & Plan:  Foot sensation, new to me, uncertain etiology and prognosis.  Type 2 DM, with DR: well-controlled  Patient Instructions  Your blood pressure is high today.  Please see a new primary care provider soon, to have it rechecked.   check your blood sugar twice a day.  vary the time of day when you check, between before the 3 meals, and at bedtime.  also check if you have symptoms of your blood sugar being too high or too low.  please keep a record of the readings and bring it to your next appointment here (or you can bring the meter itself).  You can write it on any piece of paper.  please call us sooner if your blood sugar goes below 70, or if you have a lot of readings over 200. Please continue the same Trulicity. Please see a foot specialist.  you will receive a phone call, about a day and time for an appointment Please come back for a follow-up appointment in 6 months.

## 2020-09-12 NOTE — Patient Instructions (Addendum)
Your blood pressure is high today.  Please see a new primary care provider soon, to have it rechecked.   check your blood sugar twice a day.  vary the time of day when you check, between before the 3 meals, and at bedtime.  also check if you have symptoms of your blood sugar being too high or too low.  please keep a record of the readings and bring it to your next appointment here (or you can bring the meter itself).  You can write it on any piece of paper.  please call us sooner if your blood sugar goes below 70, or if you have a lot of readings over 200. Please continue the same Trulicity. Please see a foot specialist.  you will receive a phone call, about a day and time for an appointment Please come back for a follow-up appointment in 6 months.

## 2020-09-21 ENCOUNTER — Other Ambulatory Visit: Payer: Self-pay | Admitting: Endocrinology

## 2020-09-26 ENCOUNTER — Other Ambulatory Visit: Payer: Self-pay

## 2020-09-26 ENCOUNTER — Telehealth: Payer: Self-pay

## 2020-09-26 ENCOUNTER — Other Ambulatory Visit: Payer: No Typology Code available for payment source

## 2020-09-26 NOTE — Telephone Encounter (Signed)
Pt came and dropped a Medical Leave form to be completed and faxed.

## 2020-09-28 ENCOUNTER — Other Ambulatory Visit: Payer: No Typology Code available for payment source

## 2020-09-28 ENCOUNTER — Ambulatory Visit: Payer: No Typology Code available for payment source | Admitting: Podiatry

## 2020-09-28 ENCOUNTER — Encounter: Payer: Self-pay | Admitting: Podiatry

## 2020-09-28 ENCOUNTER — Other Ambulatory Visit: Payer: Self-pay

## 2020-09-28 ENCOUNTER — Ambulatory Visit (INDEPENDENT_AMBULATORY_CARE_PROVIDER_SITE_OTHER): Payer: No Typology Code available for payment source

## 2020-09-28 DIAGNOSIS — M722 Plantar fascial fibromatosis: Secondary | ICD-10-CM

## 2020-09-28 DIAGNOSIS — M778 Other enthesopathies, not elsewhere classified: Secondary | ICD-10-CM

## 2020-09-28 DIAGNOSIS — M205X9 Other deformities of toe(s) (acquired), unspecified foot: Secondary | ICD-10-CM

## 2020-09-29 NOTE — Progress Notes (Signed)
Subjective:   Patient ID: Kaylee Lynn, female   DOB: 60 y.o.   MRN: [id number]   HPI Patient presents stating she has a full feeling on the bottom of her right foot that she states is not truly hurting but does not feel right and she has had trouble with her big toe joints left over right for a number of years and is noted enlargement.  Patient does have diabetes under good control does not smoke and is obese   Review of Systems  All other systems reviewed and are negative.       Objective:  Physical Exam Vitals and nursing note reviewed.  Constitutional:      Appearance: She is well-developed.  Pulmonary:     Effort: Pulmonary effort is normal.  Musculoskeletal:        General: Normal range of motion.  Skin:    General: Skin is warm.  Neurological:     Mental Status: She is alert.     Neurovascular status found to be intact muscle strength is found to be adequate range of motion adequate.  Patient does have moderate swelling around the second MPJ right localized to this area and does have reduced range of motion of the first MPJ left over right with spur formation dorsally and minimal discomfort when I move the joint but definite restriction of motion.  Patient has good digital perfusion well oriented x3     Assessment:  Hallux limitus condition left over right with structural change along with possibility for inflammatory capsulitis right     Plan:  H&P reviewed all conditions at this time.  At this point I have recommended utilization of shoe gear modification and educated her on hallux limitus consideration for surgical intervention at 1 point in future and also treatment of the inflamed joint if it were to get worse.  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

## 2020-11-08 ENCOUNTER — Ambulatory Visit (HOSPITAL_BASED_OUTPATIENT_CLINIC_OR_DEPARTMENT_OTHER): Payer: No Typology Code available for payment source | Admitting: Obstetrics & Gynecology

## 2020-11-14 NOTE — Progress Notes (Signed)
[facility] Initial Assessment:    Date:  11/15/2020   ID:  Kaylee Lynn, DOB 12-24-1960, MRN [id number]  PCP:  Camillia Herter, NP  Cardiologist:  None  Nephrologist:  Referring MD: Camillia Herter, NP   CC: Hypertension  History of Present Illness:    Kaylee Lynn is a 60 y.o. female with a hx of essential hypertension, anemia, bronchitis, diabetes mellitus type 2, and obesity, here to establish care in the hypertension clinic. Last seen by Durene Fruits, NP on 09/07/2020. At that appointment her amlodipine was increased to 10 mg daily.  Today, she reports being first diagnosed with hypertension in her 48's. Within the past 2-3 years, her blood pressure has gradually increased. She stopped drinking a broth supplement 2 months ago, and only saw minimal improvement in her blood pressure. Typically she has no issues with feeling her heart racing and does not bruise/bleed easily. She thinks she might snore, but is not certain. She does not fall asleep easily throughout the day. She denies any chest pain, shortness of breath, palpitations, or exertional symptoms. No headaches, lightheadedness, or syncope to report. Also has no lower extremity edema, orthopnea or PND. Every day, she drinks 3 cups of coffee, and sometimes tea and soda. She is willing to cut back on coffee. Mostly she cooks her meals, and occasionally orders out on days with appointments such as today. She consciously avoids adding salt to her food, and completely avoids canned foods. Currently she is following a diet program including 5 servings of vegetables, 11 oz of protein, 60 oz of fluid half water For pain she usually takes aleve or advil, and tylenol rarely. Supplements include Omega-3, Centrum Silver for women, Magnesium-64, and Calcium/Vitamin D. She works at [employer] and is constantly moving at work. Formal exercise is also done with an exercise bike at home, and she does not have any exertional  symptoms. She has never been a smoker, and very rarely drinks alcohol. In her family, she states most family members have hypertension including both parents. Her brother had hypertension and a stroke. Her father had a CABG and lived for 14 years after the procedure.   Past Medical History:  Diagnosis Date  . Allergy   . Anemia    history  . Bronchitis    Hx - uses inhaler prn  . Hypertension   . Type I (juvenile type) diabetes mellitus without mention of complication, not stated as uncontrolled    per patient type 2     Past Surgical History:  Procedure Laterality Date  . left eye surgery     at age 25yrs, weak muscle  . missed abortion     elective     Current Medications: Current Meds  Medication Sig  . albuterol (VENTOLIN HFA) 108 (90 Base) MCG/ACT inhaler Inhale 1-2 puffs into the lungs every 6 (six) hours as needed for wheezing or shortness of breath.  Marland Kitchen amLODipine (NORVASC) 10 MG tablet Take 1 tablet (10 mg total) by mouth daily.  . BD PEN NEEDLE NANO 2ND GEN 32G X 4 MM MISC   . Dulaglutide (TRULICITY) 3 JY/7.8GN SOPN Inject 3 mg as directed once a week.  Marland Kitchen glucose blood (ONETOUCH VERIO) test strip 1 each by Other route 2 (two) times daily. as directed  . ipratropium (ATROVENT) 0.06 % nasal spray Place 2 sprays into both nostrils 4 (four) times daily.  Marland Kitchen losartan-hydrochlorothiazide (HYZAAR) 100-25 MG tablet Take 1 tablet by mouth daily.  Marland Kitchen  meclizine (ANTIVERT) 25 MG tablet Take 1 tablet (25 mg total) by mouth 3 (three) times daily as needed for dizziness.  . Omega-3 Fatty Acids (OMEGA 3 PO) Take 1 tablet by mouth.  . ondansetron (ZOFRAN-ODT) 4 MG disintegrating tablet Take 1 tablet (4 mg total) by mouth every 8 (eight) hours as needed for nausea or vomiting.  Glory Rosebush DELICA LANCETS 67T MISC USE AS DIRECTED TWICE A DAY  . spironolactone (ALDACTONE) 25 MG tablet Take 1 tablet (25 mg total) by mouth daily.  Marland Kitchen triamcinolone (KENALOG) 0.1 % APPLY 1 APPLICATION 3 (THREE)  TIMES DAILY AS NEEDED FOR ITCHING OR RASH     Allergies:   Patient has no known allergies.   Social History   Socioeconomic History  . Marital status: Single    Spouse name: Not on file  . Number of children: Not on file  . Years of education: Not on file  . Highest education level: Not on file  Occupational History  . Not on file  Tobacco Use  . Smoking status: Never Smoker  . Smokeless tobacco: Never Used  Vaping Use  . Vaping Use: Never used  Substance and Sexual Activity  . Alcohol use: Yes    Alcohol/week: 1.0 standard drink    Types: 1 Glasses of wine per week    Comment: occasional  . Drug use: No  . Sexual activity: Yes    Birth control/protection: Post-menopausal  Other Topics Concern  . Not on file  Social History Narrative  . Not on file   Social Determinants of Health   Financial Resource Strain: Not on file  Food Insecurity: Not on file  Transportation Needs: Not on file  Physical Activity: Not on file  Stress: Not on file  Social Connections: Not on file     Family History: The patient's family history includes CAD in her father; Hypertension in her brother, father, mother, and sister; Stroke in her brother. There is no history of Colon cancer, Esophageal cancer, Rectal cancer, or Stomach cancer.  ROS:   Please see the history of present illness.    All other systems reviewed and are negative.  EKGs/Labs/Other Studies Reviewed:    EKG:   11/15/2020: NSR, rate 74 bpm, Poor R wave progression, LAD  Recent Labs: 04/29/2020: BUN 18; Creatinine, Ser 0.63; Potassium 4.3; Sodium 137   Recent Lipid Panel    Component Value Date/Time   CHOL 159 10/23/2017 0908   TRIG 62.0 10/23/2017 0908   HDL 52.80 10/23/2017 0908   CHOLHDL 3 10/23/2017 0908   VLDL 12.4 10/23/2017 0908   LDLCALC 94 10/23/2017 0908    Physical Exam:   VS:  BP (!) 210/84 (BP Location: Right Arm, Patient Position: Sitting)   Pulse 74   Ht 4' 10.5" (1.486 m)   Wt 217 lb (98.4  kg)   LMP 07/16/2012   SpO2 97%   BMI 44.58 kg/m  , BMI Body mass index is 44.58 kg/m. GENERAL:  Well appearing HEENT: Pupils equal round and reactive, fundi not visualized, oral mucosa unremarkable NECK:  No jugular venous distention, waveform within normal limits, carotid upstroke brisk and symmetric, no bruits, no thyromegaly LYMPHATICS:  No cervical adenopathy LUNGS:  Clear to auscultation bilaterally HEART:  RRR.  PMI not displaced or sustained,S1 and S2 within normal limits, no S3, no S4, no clicks, no rubs, no murmurs ABD:  Flat, positive bowel sounds normal in frequency in pitch, no bruits, no rebound, no guarding, no midline pulsatile mass,  no hepatomegaly, no splenomegaly EXT:  2 plus pulses throughout, no edema, no cyanosis no clubbing SKIN:  No rashes no nodules NEURO:  Cranial nerves II through XII grossly intact, motor grossly intact throughout PSYCH:  Cognitively intact, oriented to person place and time   ASSESSMENT/PLAN:    Resistant hypertension Blood pressure poorly controlled and was higher on repeat.  She is eager to get this better controlled.  We will add spironolactone 25 mg daily and check a basic metabolic panel in a week.  Continue losartan/HCTZ for now given that she just had it refilled for 90 days.  When this is up, would recommend switching to valsartan and chlorthalidone.  Continue amlodipine.  She is interested in enrolling in the PREP program through the [facility].  She also consents to enrolling in the [employer] remote patient monitoring program.  She is going to work on increasing her exercise to 150 minutes weekly and is already working on sodium intake.  We will also get renal artery Dopplers and a sleep study to evaluate for secondary causes of hypertension given that her blood pressure was relatively stable until the last couple years.   Screening for Secondary Hypertension:  Causes 11/15/2020  Drugs/Herbals Screened     - Comments 3 regular coffees, salt   Renovascular HTN Screened     - Comments Check renal artery Dopplers  Sleep Apnea Screened     - Comments snores  Thyroid Disease Screened     - Comments Check TSH  Hyperaldosteronism Screened     - Comments She will eventually need renal and and aldosterone checked but not now.  Unable to hold losartan due to poorly controlled blood pressure.  Pheochromocytoma N/A  Cushing's Syndrome N/A  Hyperparathyroidism N/A  Coarctation of the Aorta Screened     - Comments BP symmetric  Compliance Screened    Relevant Labs/Studies: Basic Labs Latest Ref Rng & Units 04/29/2020 07/08/2018 10/23/2017  Sodium 135 - 145 mmol/L 137 133(L) 133(L)  Potassium 3.5 - 5.1 mmol/L 4.3 4.4 3.6  Creatinine 0.44 - 1.00 mg/dL 0.63 0.65 0.55    Thyroid  Latest Ref Rng & Units 10/23/2017 07/20/2016  TSH 0.35 - 4.50 uIU/mL 0.97 1.48             Renovascular  11/15/2020  Renal Artery [place] Completed Yes      Disposition:    FU with PharmD in 2 months. FU with MD in 4 months.    Medication Adjustments/Labs and Tests Ordered: Current medicines are reviewed at length with the patient today.  Concerns regarding medicines are outlined above.  Orders Placed This Encounter  Procedures  . Basic metabolic panel  . TSH  . Split night study  . VAS US RENAL ARTERY DUPLEX   Meds ordered this encounter  Medications  . spironolactone (ALDACTONE) 25 MG tablet    Sig: Take 1 tablet (25 mg total) by mouth daily.    Dispense:  90 tablet    Refill:  3   I,Mathew Stumpf,acting as a scribe for Skeet Latch, MD.,have documented all relevant documentation on the behalf of Skeet Latch, MD,as directed by  Skeet Latch, MD while in the presence of Skeet Latch, MD.  I, Ligonier Oval Linsey, MD have reviewed all documentation for this visit.  The documentation of the exam, diagnosis, procedures, and orders on 11/15/2020 are all accurate and complete.   Signed, Skeet Latch, MD  11/15/2020 12:52 PM    [facility]

## 2020-11-15 ENCOUNTER — Other Ambulatory Visit: Payer: Self-pay

## 2020-11-15 ENCOUNTER — Ambulatory Visit: Payer: No Typology Code available for payment source | Admitting: Cardiovascular Disease

## 2020-11-15 ENCOUNTER — Encounter: Payer: Self-pay | Admitting: Cardiovascular Disease

## 2020-11-15 VITALS — BP 210/84 | HR 74 | Ht 58.5 in | Wt 217.0 lb

## 2020-11-15 DIAGNOSIS — R0683 Snoring: Secondary | ICD-10-CM | POA: Diagnosis not present

## 2020-11-15 DIAGNOSIS — I10 Essential (primary) hypertension: Secondary | ICD-10-CM

## 2020-11-15 DIAGNOSIS — Z5181 Encounter for therapeutic drug level monitoring: Secondary | ICD-10-CM

## 2020-11-15 MED ORDER — SPIRONOLACTONE 25 MG PO TABS: 25.0000 mg | ORAL_TABLET | Freq: Every day | ORAL | 3 refills | Status: DC

## 2020-11-15 NOTE — Assessment & Plan Note (Signed)
Blood pressure poorly controlled and was higher on repeat.  She is eager to get this better controlled.  We will add spironolactone 25 mg daily and check a basic metabolic panel in a week.  Continue losartan/HCTZ for now given that she just had it refilled for 90 days.  When this is up, would recommend switching to valsartan and chlorthalidone.  Continue amlodipine.  She is interested in enrolling in the PREP program through the [facility].  She also consents to enrolling in the [employer] remote patient monitoring program.  She is going to work on increasing her exercise to 150 minutes weekly and is already working on sodium intake.  We will also get renal artery Dopplers and a sleep study to evaluate for secondary causes of hypertension given that her blood pressure was relatively stable until the last couple years.

## 2020-11-15 NOTE — Patient Instructions (Addendum)
Medication Instructions:  START SPIRONOLACTONE 25 MG DAILY    Labwork: BMET/TSH IN 1 WEEK    Testing/Procedures: Your physician has requested that you have a renal artery duplex. During this test, an ultrasound is used to evaluate blood flow to the kidneys. Allow one hour for this exam. Do not eat after midnight the day before and avoid carbonated beverages. Take your medications as you usually do.  Your physician has recommended that you have a sleep study. This test records several body functions during sleep, including: brain activity, eye movement, oxygen and carbon dioxide blood levels, heart rate and rhythm, breathing rate and rhythm, the flow of air through your mouth and nose, snoring, body muscle movements, and chest and belly movement. ONCE YOUR INSURANCE HAS APPROVED THE OFFICE WILL CALL YOU TO SCHEDULE. IF YOU DO  NOT HEAR IN 2 WEEKS CALL [phone] TO FOLLOW UP    Follow-Up: 01/11/2021 AT 10:30 WITH PHARM D    You will receive a phone call from the PREP exercise and nutrition program to schedule an initial assessment.   Special Instructions:   MONITOR YOUR BLOOD PRESSURE TWICE A DAY WITH MACHINE PROVIDED. MAKE SUR YOU ARE IN YOUR APP WHEN YOU CHECK BLOOD PRESSURE   DASH Eating Plan DASH stands for "Dietary Approaches to Stop Hypertension." The DASH eating plan is a healthy eating plan that has been shown to reduce high blood pressure (hypertension). It may also reduce your risk for type 2 diabetes, heart disease, and stroke. The DASH eating plan may also help with weight loss. What are tips for following this plan?  General guidelines  Avoid eating more than 2,300 mg (milligrams) of salt (sodium) a day. If you have hypertension, you may need to reduce your sodium intake to 1,500 mg a day.  Limit alcohol intake to no more than 1 drink a day for nonpregnant women and 2 drinks a day for men. One drink equals 12 oz of beer, 5 oz of wine, or 1 oz of hard liquor.  Work with  your health care provider to maintain a healthy body weight or to lose weight. Ask what an ideal weight is for you.  Get at least 30 minutes of exercise that causes your heart to beat faster (aerobic exercise) most days of the week. Activities may include walking, swimming, or biking.  Work with your health care provider or diet and nutrition specialist (dietitian) to adjust your eating plan to your individual calorie needs. Reading food labels   Check food labels for the amount of sodium per serving. Choose foods with less than 5 percent of the Daily Value of sodium. Generally, foods with less than 300 mg of sodium per serving fit into this eating plan.  To find whole grains, look for the word "whole" as the first word in the ingredient list. Shopping  Buy products labeled as "low-sodium" or "no salt added."  Buy fresh foods. Avoid canned foods and premade or frozen meals. Cooking  Avoid adding salt when cooking. Use salt-free seasonings or herbs instead of table salt or sea salt. Check with your health care provider or pharmacist before using salt substitutes.  Do not fry foods. Cook foods using healthy methods such as baking, boiling, grilling, and broiling instead.  Cook with heart-healthy oils, such as olive, canola, soybean, or sunflower oil. Meal planning  Eat a balanced diet that includes: ? 5 or more servings of fruits and vegetables each day. At each meal, try to fill half of your  plate with fruits and vegetables. ? Up to 6-8 servings of whole grains each day. ? Less than 6 oz of lean meat, poultry, or fish each day. A 3-oz serving of meat is about the same size as a deck of cards. One egg equals 1 oz. ? 2 servings of low-fat dairy each day. ? A serving of nuts, seeds, or beans 5 times each week. ? Heart-healthy fats. Healthy fats called Omega-3 fatty acids are found in foods such as flaxseeds and coldwater fish, like sardines, salmon, and mackerel.  Limit how much you eat  of the following: ? Canned or prepackaged foods. ? Food that is high in trans fat, such as fried foods. ? Food that is high in saturated fat, such as fatty meat. ? Sweets, desserts, sugary drinks, and other foods with added sugar. ? Full-fat dairy products.  Do not salt foods before eating.  Try to eat at least 2 vegetarian meals each week.  Eat more home-cooked food and less restaurant, buffet, and fast food.  When eating at a restaurant, ask that your food be prepared with less salt or no salt, if possible. What foods are recommended? The items listed may not be a complete list. Talk with your dietitian about what dietary choices are best for you. Grains Whole-grain or whole-wheat bread. Whole-grain or whole-wheat pasta. Brown rice. [place] Morrow. Bulgur. Whole-grain and low-sodium cereals. Pita bread. Low-fat, low-sodium crackers. Whole-wheat flour tortillas. Vegetables Fresh or frozen vegetables (raw, steamed, roasted, or grilled). Low-sodium or reduced-sodium tomato and vegetable juice. Low-sodium or reduced-sodium tomato sauce and tomato paste. Low-sodium or reduced-sodium canned vegetables. Fruits All fresh, dried, or frozen fruit. Canned fruit in natural juice (without added sugar). Meat and other protein foods Skinless chicken or [place]. Ground chicken or [place]. Pork with fat trimmed off. Fish and seafood. Egg whites. Dried beans, peas, or lentils. Unsalted nuts, nut butters, and seeds. Unsalted canned beans. Lean cuts of beef with fat trimmed off. Low-sodium, lean deli meat. Dairy Low-fat (1%) or fat-free (skim) milk. Fat-free, low-fat, or reduced-fat cheeses. Nonfat, low-sodium ricotta or cottage cheese. Low-fat or nonfat yogurt. Low-fat, low-sodium cheese. Fats and oils Soft margarine without trans fats. Vegetable oil. Low-fat, reduced-fat, or light mayonnaise and salad dressings (reduced-sodium). Canola, safflower, olive, soybean, and sunflower oils. Avocado. Seasoning and  other foods Herbs. Spices. Seasoning mixes without salt. Unsalted popcorn and pretzels. Fat-free sweets. What foods are not recommended? The items listed may not be a complete list. Talk with your dietitian about what dietary choices are best for you. Grains Baked goods made with fat, such as croissants, muffins, or some breads. Dry pasta or rice meal packs. Vegetables Creamed or fried vegetables. Vegetables in a cheese sauce. Regular canned vegetables (not low-sodium or reduced-sodium). Regular canned tomato sauce and paste (not low-sodium or reduced-sodium). Regular tomato and vegetable juice (not low-sodium or reduced-sodium). Angie Fava. Olives. Fruits Canned fruit in a light or heavy syrup. Fried fruit. Fruit in cream or butter sauce. Meat and other protein foods Fatty cuts of meat. Ribs. Fried meat. Berniece Salines. Sausage. Bologna and other processed lunch meats. Salami. Fatback. Hotdogs. Bratwurst. Salted nuts and seeds. Canned beans with added salt. Canned or smoked fish. Whole eggs or egg yolks. Chicken or [place] with skin. Dairy Whole or 2% milk, cream, and half-and-half. Whole or full-fat cream cheese. Whole-fat or sweetened yogurt. Full-fat cheese. Nondairy creamers. Whipped toppings. Processed cheese and cheese spreads. Fats and oils Butter. Stick margarine. Lard. Shortening. Ghee. Bacon fat. Tropical oils, such as  coconut, palm kernel, or palm oil. Seasoning and other foods Salted popcorn and pretzels. Onion salt, garlic salt, seasoned salt, table salt, and sea salt. Worcestershire sauce. Tartar sauce. Barbecue sauce. Teriyaki sauce. Soy sauce, including reduced-sodium. Steak sauce. Canned and packaged gravies. Fish sauce. Oyster sauce. Cocktail sauce. Horseradish that you find on the shelf. Ketchup. Mustard. Meat flavorings and tenderizers. Bouillon cubes. Hot sauce and Tabasco sauce. Premade or packaged marinades. Premade or packaged taco seasonings. Relishes. Regular salad dressings. Where to  find more information:  National Heart, Lung, and [place]: https://wilson-eaton.com/  American Heart Association: www.heart.org Summary  The DASH eating plan is a healthy eating plan that has been shown to reduce high blood pressure (hypertension). It may also reduce your risk for type 2 diabetes, heart disease, and stroke.  With the DASH eating plan, you should limit salt (sodium) intake to 2,300 mg a day. If you have hypertension, you may need to reduce your sodium intake to 1,500 mg a day.  When on the DASH eating plan, aim to eat more fresh fruits and vegetables, whole grains, lean proteins, low-fat dairy, and heart-healthy fats.  Work with your health care provider or diet and nutrition specialist (dietitian) to adjust your eating plan to your individual calorie needs. This information is not intended to replace advice given to you by your health care provider. Make sure you discuss any questions you have with your health care provider. Document Released: 05/31/2011 Document Revised: 05/24/2017 Document Reviewed: 06/04/2016 Elsevier Patient Education  2020 [employer].

## 2020-11-16 ENCOUNTER — Telehealth: Payer: Self-pay | Admitting: *Deleted

## 2020-11-16 NOTE — Telephone Encounter (Signed)
-----   Message from Earvin Hansen, LPN sent at 02/11/6014  5:52 PM EDT ----- Hello all  Patient needs sleep study Thanks Rip Harbour

## 2020-11-16 NOTE — Telephone Encounter (Signed)
Prior Authorization for split night study sent to [facility] via web portal. Tracking Number [id number].Marland Kitchen

## 2020-11-17 ENCOUNTER — Telehealth: Payer: Self-pay

## 2020-11-19 NOTE — Telephone Encounter (Signed)
Left voicemail; interest in PREP program at [facility]

## 2020-11-25 ENCOUNTER — Telehealth: Payer: Self-pay | Admitting: Pharmacist Clinician (PhC)/ Clinical Pharmacy Specialist

## 2020-11-25 NOTE — Telephone Encounter (Signed)
-----   Message from Avelino Leeds sent at 11/24/2020  2:53 PM EDT ----- Regarding: [employer]- Elevate BP Hi Trustin Chapa,  This patient's BP reading today was 157/78. However her readings yesterday were 147/79 and 184/99.  Best, Amy

## 2020-11-25 NOTE — Telephone Encounter (Signed)
LMOM for patient.  BP has improved since starting spironolactone, will need to get metabolic panel draw early next week in order to adjust medications

## 2020-11-28 ENCOUNTER — Telehealth: Payer: Self-pay

## 2020-11-28 DIAGNOSIS — Z0000 Encounter for general adult medical examination without abnormal findings: Secondary | ICD-10-CM

## 2020-11-28 NOTE — Telephone Encounter (Signed)
Called patient to determine if she was feeling okay and if she took her medication before checking her blood pressure today. Today's reading was 166/99. A message was sent to [facility] regarding patient's reading. Left message for patient to return call to care guide at [phone].

## 2020-11-28 NOTE — Telephone Encounter (Signed)
Spoke with patient, she is doing well with current meds, no problems starting spironolactone.  She will come by office today to get metabolic panel.  I told her we would not change any meds at this time (she's going on vacation tomorrow) as her pressure is much improved from original office reading, even though not yet to goal.

## 2020-11-28 NOTE — Telephone Encounter (Signed)
Patient calling in response to BP.  She is returning your call.

## 2020-11-28 NOTE — Telephone Encounter (Signed)
Returned initial phone call to pt to discuss interest/availability for PREP Class. Wants to participate and prefers morning/evening class at either location. Will contact later this month once July class schedule has been set, most likely [place] M/W at 7:30am.

## 2020-11-29 LAB — BASIC METABOLIC PANEL
BUN/Creatinine Ratio: 31 — ABNORMAL HIGH (ref 12–28)
BUN: 20 mg/dL (ref 8–27)
CO2: 23 mmol/L (ref 20–29)
Calcium: 9.7 mg/dL (ref 8.7–10.3)
Chloride: 99 mmol/L (ref 96–106)
Creatinine, Ser: 0.64 mg/dL (ref 0.57–1.00)
Glucose: 93 mg/dL (ref 65–99)
Potassium: 4.7 mmol/L (ref 3.5–5.2)
Sodium: 137 mmol/L (ref 134–144)
eGFR: 100 mL/min/{1.73_m2} (ref >59)

## 2020-11-29 LAB — TSH: TSH: 1.16 u[IU]/mL (ref 0.450–4.500)

## 2020-11-29 NOTE — Addendum Note (Signed)
Addended by: Wonda Horner on: 11/29/2020 04:40 PM   Modules accepted: Orders

## 2020-11-30 ENCOUNTER — Other Ambulatory Visit: Payer: Self-pay | Admitting: Cardiovascular Disease

## 2020-11-30 DIAGNOSIS — I10 Essential (primary) hypertension: Secondary | ICD-10-CM

## 2020-11-30 DIAGNOSIS — R0683 Snoring: Secondary | ICD-10-CM

## 2020-12-05 ENCOUNTER — Telehealth: Payer: Self-pay | Admitting: Pharmacist Clinician (PhC)/ Clinical Pharmacy Specialist

## 2020-12-05 NOTE — Telephone Encounter (Signed)
Labs stable.  Would like to increase spironolactone or switch losartan hctz.  LM for patient to return call

## 2020-12-06 ENCOUNTER — Encounter (HOSPITAL_COMMUNITY): Payer: No Typology Code available for payment source

## 2020-12-08 ENCOUNTER — Ambulatory Visit (HOSPITAL_COMMUNITY)
Admission: RE | Admit: 2020-12-08 | Discharge: 2020-12-08 | Disposition: A | Discharge status: Home / Self Care | Payer: No Typology Code available for payment source | Source: Ambulatory Visit | Attending: Internal Medicine | Admitting: Internal Medicine

## 2020-12-08 ENCOUNTER — Telehealth: Payer: Self-pay | Admitting: *Deleted

## 2020-12-08 ENCOUNTER — Other Ambulatory Visit: Payer: Self-pay

## 2020-12-08 DIAGNOSIS — I10 Essential (primary) hypertension: Secondary | ICD-10-CM

## 2020-12-08 NOTE — Telephone Encounter (Signed)
Phone call could not be completed. Will send a Mychart message.

## 2020-12-08 NOTE — Telephone Encounter (Signed)
[employer] denied request for in lab sleep study. Ordering provider may order HST w/o having to get a PA.

## 2020-12-15 DIAGNOSIS — I10 Essential (primary) hypertension: Secondary | ICD-10-CM

## 2020-12-21 NOTE — Telephone Encounter (Signed)
You have been scheduled for a home sleep study test on January 24, 2021 @ 10:00 am. This appointment will take about 30-45 minutes. You will receive a packet of information from the sleep lab. If you cannot make this appointment, please call them at [phone] to reschedule.

## 2020-12-28 ENCOUNTER — Telehealth: Payer: Self-pay

## 2020-12-28 NOTE — Telephone Encounter (Signed)
Voicemail left to discuss upcoming PREP program starting at [facility] July 19.

## 2021-01-11 ENCOUNTER — Other Ambulatory Visit: Payer: Self-pay

## 2021-01-11 ENCOUNTER — Ambulatory Visit (INDEPENDENT_AMBULATORY_CARE_PROVIDER_SITE_OTHER)
Payer: No Typology Code available for payment source | Admitting: Pharmacist Clinician (PhC)/ Clinical Pharmacy Specialist

## 2021-01-11 DIAGNOSIS — I10 Essential (primary) hypertension: Secondary | ICD-10-CM

## 2021-01-11 MED ORDER — OLMESARTAN MEDOXOMIL 40 MG PO TABS: 40.0000 mg | ORAL_TABLET | Freq: Every day | ORAL | 6 refills | Status: DC

## 2021-01-11 NOTE — Assessment & Plan Note (Signed)
Patient with resistant hypertension, currently not at goal on combination of 4 medications.  Will have her discontinue losartan hctz and start olmesartan 40 mg daily.  We may need to later add in chlorthalidone, but will see what benefit the olmesartan gives first.  She should continue with the amlodipine and spironolactone.  Asked that she check her pressure at home twice daily and let the app sync to get the correct times and readings.  She was given the Viv[employer] phone number

## 2021-01-11 NOTE — Progress Notes (Signed)
01/12/2021 Kaylee Lynn 08/08/1960 [id number]   HPI:  Kaylee Lynn is a 60 y.o. female patient of Dr Oval Linsey, who presents today for advanced hypertension clinic follow up.  In addition to hypertension her medical history is significant for  DM2 (with A1c 6.4 earlier this year).  She was referred by Durene Fruits NP for uncontrolled hypertension and when she saw Dr. Oval Linsey her pressure was noted to be 210/84.  Patient reported that she was diagnosed in her 73's and it had been steadily increasing for the past few years.  At the time of her visit she was on losartan hctz 100/25 mg and had just filled a 90 day supply.  She was also on amlodipine 10 mg, so Dr. Oval Linsey added spironolactone 25 mg daily.  A metabolic panel drawn after a week showed kidney function and potassium levels stable.  She was set up with a Vivify home monitor and asked to check home BP readings once or twice daily.    Since then we have been monitoring her pressures thru the Vivify app.  While still not at goal they are much improved from the initial 170 systolic reading.  She is in the office today for follow up.  She admits to sometimes spotty wi-fi service at home and has been entering some of her readings manually.  I explained that her home cuff should download all readings since the last sync and she shouldn't need to do that.  She is doing well today, overall happy with the drop in pressure since seeing Dr. Oval Linsey for the first time.   Blood Pressure Goal:  130/80  Current Medications: amlodipine 10 mg qd, losartan hctz 100/25 qd, spironolactone 25 mg qd  Family Hx: both parents with hypertension, father with CABG  Social Hx: no tobacco, no alcohol, now drinking decaf after first cup of coffee (3 total); occasional soda  Diet: mix of eating out and home cooked; was eating out some but noticed increase in weight, so working back to mostly home cooked; trying to cut back on sodium; loves vegetables -  about 5 servings per day (mostly fresh, sometimes frozen); protein is mostly chicken and beef  Exercise: walks at park, walks dog (50+ pound), has been called by PREP - will hopefully be starting soon  Home BP readings:  from [place] - last 4 weeks show morning average 145/84 and evening average 150/82.  Still having some outliers (high was 191/119), but now starting to see some 130/70 range readings as well.    Intolerances: knda  Labs: 6/22:  Na 137, K 4.7, Glu 93, BUN 20, SCr 0.64, GFR 100   Wt Readings from Last 3 Encounters:  01/11/21 225 lb (102.1 kg)  11/15/20 217 lb (98.4 kg)  09/12/20 203 lb 9.6 oz (92.4 kg)   BP Readings from Last 3 Encounters:  01/11/21 (!) 148/88  11/15/20 (!) 210/84  09/12/20 (!) 170/80   Pulse Readings from Last 3 Encounters:  01/11/21 72  11/15/20 74  09/12/20 90    Current Outpatient Medications  Medication Sig Dispense Refill   albuterol (VENTOLIN HFA) 108 (90 Base) MCG/ACT inhaler Inhale 1-2 puffs into the lungs every 6 (six) hours as needed for wheezing or shortness of breath. 1 g 0   amLODipine (NORVASC) 10 MG tablet Take 1 tablet (10 mg total) by mouth daily. 90 tablet 0   BD PEN NEEDLE NANO 2ND GEN 32G X 4 MM MISC      Dulaglutide (  TRULICITY) 3 FF/6.3WG SOPN Inject 3 mg as directed once a week. 6 mL 3   glucose blood (ONETOUCH VERIO) test strip 1 each by Other route 2 (two) times daily. as directed 180 strip 3   magnesium chloride (SLOW-MAG) 64 MG TBEC SR tablet Take 1 tablet by mouth daily.     Multiple Vitamins-Minerals (CENTRUM SILVER 50+WOMEN PO) Take 1 tablet by mouth daily.     olmesartan (BENICAR) 40 MG tablet Take 1 tablet (40 mg total) by mouth daily. 30 tablet 6   Omega-3 Fatty Acids (OMEGA 3 PO) Take 1 tablet by mouth.     ONETOUCH DELICA LANCETS 66Z MISC USE AS DIRECTED TWICE A DAY 100 each 8   spironolactone (ALDACTONE) 25 MG tablet Take 1 tablet (25 mg total) by mouth daily. 90 tablet 3   vitamin C (ASCORBIC ACID) 500 MG  tablet Take 500 mg by mouth daily.     ipratropium (ATROVENT) 0.06 % nasal spray Place 2 sprays into both nostrils 4 (four) times daily. (Patient not taking: Reported on 01/11/2021) 15 mL 0   triamcinolone (KENALOG) 0.1 % APPLY 1 APPLICATION 3 (THREE) TIMES DAILY AS NEEDED FOR ITCHING OR RASH 80 g 0   No current facility-administered medications for this visit.    No Known Allergies  Past Medical History:  Diagnosis Date   Allergy    Anemia    history   Bronchitis    Hx - uses inhaler prn   Hypertension    Type I (juvenile type) diabetes mellitus without mention of complication, not stated as uncontrolled    per patient type 2     Blood pressure (!) 148/88, pulse 72, height 4\' 11"  (1.499 m), weight 225 lb (102.1 kg), last menstrual period 07/16/2012.  Resistant hypertension Patient with resistant hypertension, currently not at goal on combination of 4 medications.  Will have her discontinue losartan hctz and start olmesartan 40 mg daily.  We may need to later add in chlorthalidone, but will see what benefit the olmesartan gives first.  She should continue with the amlodipine and spironolactone.  Asked that she check her pressure at home twice daily and let the app sync to get the correct times and readings.  She was given the [facility] phone number    Tommy Medal PharmD CPP [place] [address] [place], [place] [address] [phone]

## 2021-01-11 NOTE — Patient Instructions (Signed)
Return for a a follow up appointment Wednesday August 31 at 10:30 am  Check your blood pressure at home twice daily and keep record of the readings.  Take your BP meds as follows:  STOP LOSARTAN HCTZ  START OLMESARTAN 40 MG ONCE DAILY  If you have any questions about the Vivify app or your BP cuff, please reach out to [place] at [phone].  Bring all of your meds, your BP cuff and your record of home blood pressures to your next appointment.  Exercise as you're able, try to walk approximately 30 minutes per day.  Keep salt intake to a minimum, especially watch canned and prepared boxed foods.  Eat more fresh fruits and vegetables and fewer canned items.  Avoid eating in fast food restaurants.    HOW TO TAKE YOUR BLOOD PRESSURE: Rest 5 minutes before taking your blood pressure.  Don't smoke or drink caffeinated beverages for at least 30 minutes before. Take your blood pressure before (not after) you eat. Sit comfortably with your back supported and both feet on the floor (don't cross your legs). Elevate your arm to heart level on a table or a desk. Use the proper sized cuff. It should fit smoothly and snugly around your bare upper arm. There should be enough room to slip a fingertip under the cuff. The bottom edge of the cuff should be 1 inch above the crease of the elbow. Ideally, take 3 measurements at one sitting and record the average.

## 2021-01-14 DIAGNOSIS — I10 Essential (primary) hypertension: Secondary | ICD-10-CM

## 2021-01-18 ENCOUNTER — Ambulatory Visit (HOSPITAL_BASED_OUTPATIENT_CLINIC_OR_DEPARTMENT_OTHER): Payer: No Typology Code available for payment source | Admitting: Obstetrics & Gynecology

## 2021-01-18 ENCOUNTER — Other Ambulatory Visit: Payer: Self-pay | Admitting: Endocrinology

## 2021-01-18 IMAGING — DX DG CHEST 2V
2 series · 2 of 2 positions shown · non-contrast
Comparison: Chest radiograph 04/23/2014.

CLINICAL DATA: Fever, cough.

EXAM:
CHEST - 2 VIEW

[chest pa]
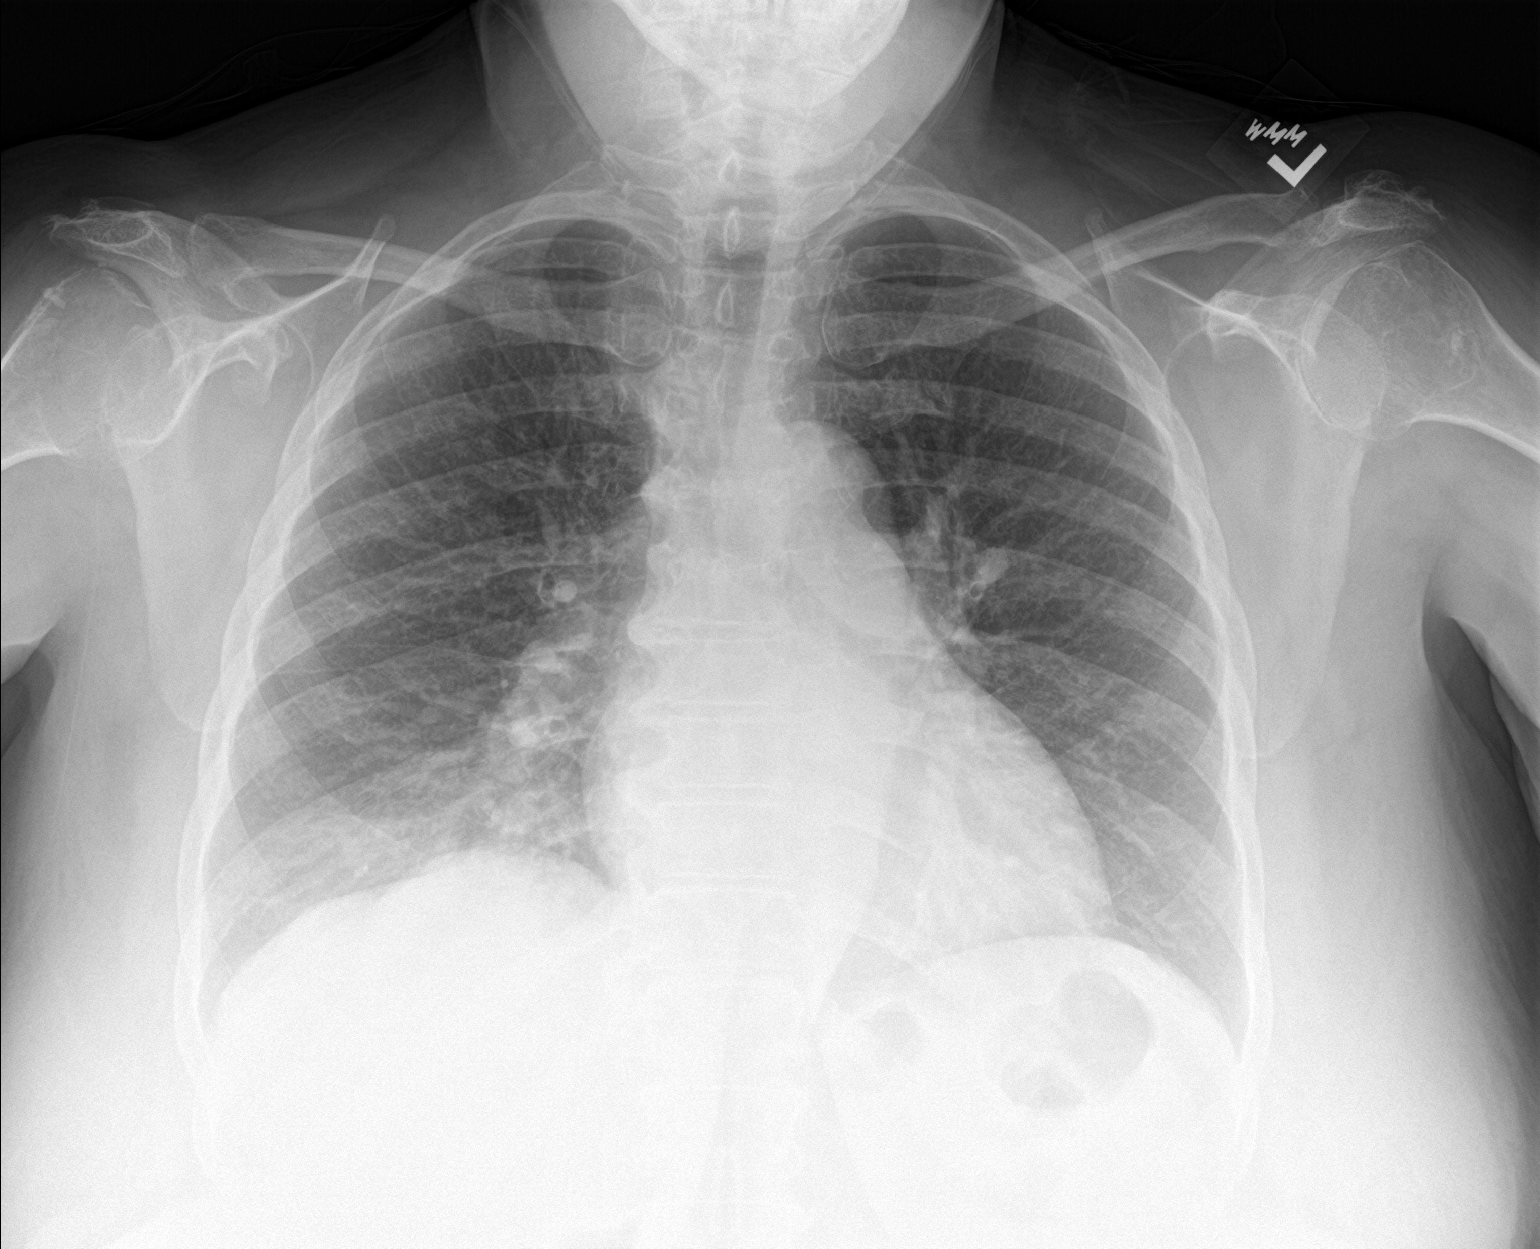

[chest lat]
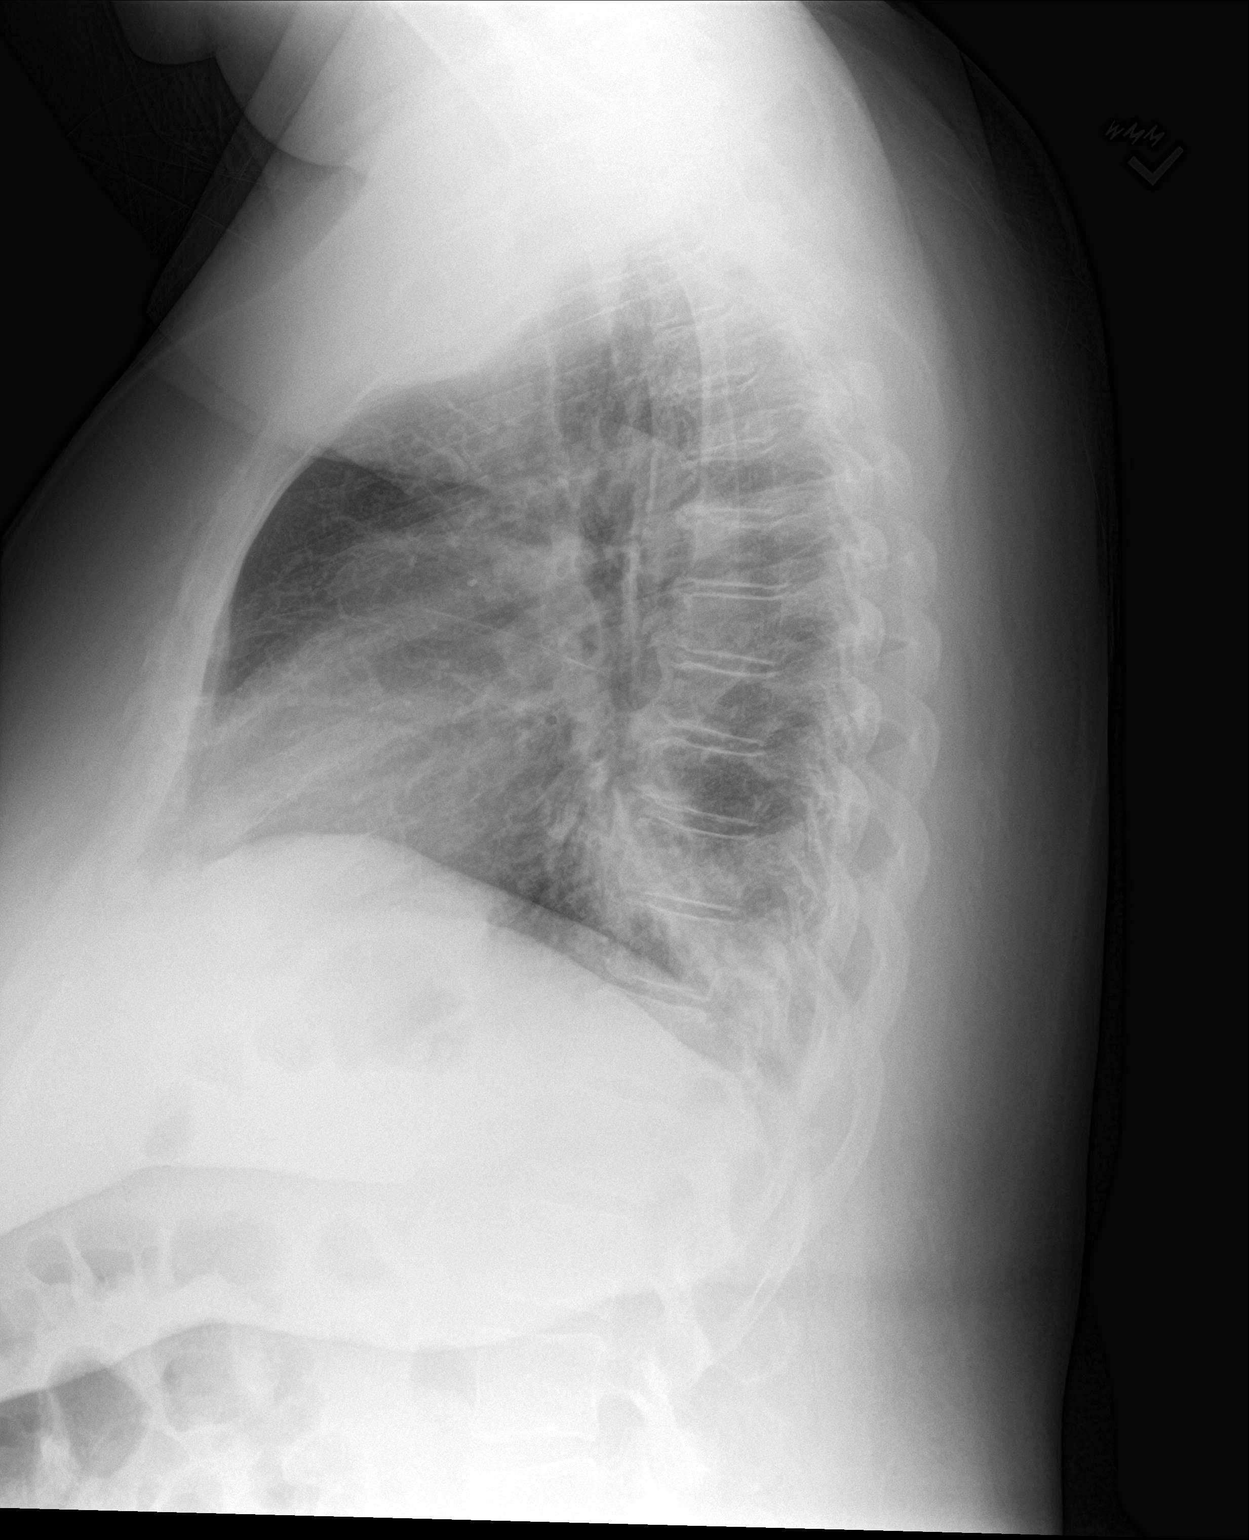

[2 of 2 positions shown; findings below may reference images not displayed]

FINDINGS: Right lower lung airspace opacities. No visible pleural effusions or
pneumothorax. Cardiomediastinal silhouette is within normal limits.
No acute osseous abnormality.
IMPRESSION: Right lower lung airspace opacities, concerning for pneumonia.

## 2021-01-24 ENCOUNTER — Other Ambulatory Visit: Payer: Self-pay

## 2021-01-24 ENCOUNTER — Ambulatory Visit (HOSPITAL_BASED_OUTPATIENT_CLINIC_OR_DEPARTMENT_OTHER): Payer: No Typology Code available for payment source | Attending: Cardiovascular Disease

## 2021-01-24 ENCOUNTER — Telehealth: Payer: Self-pay | Admitting: Endocrinology

## 2021-01-24 DIAGNOSIS — I10 Essential (primary) hypertension: Secondary | ICD-10-CM

## 2021-01-24 DIAGNOSIS — E1165 Type 2 diabetes mellitus with hyperglycemia: Secondary | ICD-10-CM

## 2021-01-24 DIAGNOSIS — R0683 Snoring: Secondary | ICD-10-CM

## 2021-01-24 MED ORDER — TRULICITY 3 MG/0.5ML ~~LOC~~ SOAJ: 3.0000 mg | SUBCUTANEOUS | 3 refills | Status: DC

## 2021-01-24 NOTE — Telephone Encounter (Signed)
Pt needs a refill  Dulaglutide (TRULICITY) 3 0000000 SOPN  [id number]- [place], [place] - [address] Pt has an upcoming app on 03/15/2021

## 2021-01-27 ENCOUNTER — Encounter: Payer: Self-pay | Admitting: Endocrinology

## 2021-01-30 ENCOUNTER — Telehealth: Payer: Self-pay

## 2021-01-30 ENCOUNTER — Other Ambulatory Visit: Payer: Self-pay

## 2021-01-30 DIAGNOSIS — E1165 Type 2 diabetes mellitus with hyperglycemia: Secondary | ICD-10-CM

## 2021-01-30 NOTE — Telephone Encounter (Addendum)
I have spoken with the pt regarding her refill on Trulicity

## 2021-01-31 ENCOUNTER — Other Ambulatory Visit: Payer: Self-pay | Admitting: Family

## 2021-01-31 DIAGNOSIS — I10 Essential (primary) hypertension: Secondary | ICD-10-CM

## 2021-02-12 ENCOUNTER — Emergency Department (HOSPITAL_COMMUNITY)
Admission: EM | Admit: 2021-02-12 | Discharge: 2021-02-12 | Disposition: A | Discharge status: Home / Self Care | Payer: No Typology Code available for payment source | Attending: Emergency Medicine | Admitting: Emergency Medicine

## 2021-02-12 ENCOUNTER — Emergency Department (HOSPITAL_COMMUNITY): Payer: No Typology Code available for payment source

## 2021-02-12 ENCOUNTER — Ambulatory Visit
Admission: EM | Admit: 2021-02-12 | Discharge: 2021-02-12 | Disposition: A | Discharge status: Home / Self Care | Payer: No Typology Code available for payment source

## 2021-02-12 ENCOUNTER — Encounter: Payer: Self-pay | Admitting: Emergency Medicine

## 2021-02-12 ENCOUNTER — Encounter (HOSPITAL_COMMUNITY): Payer: Self-pay | Admitting: Emergency Medicine

## 2021-02-12 ENCOUNTER — Other Ambulatory Visit: Payer: Self-pay

## 2021-02-12 DIAGNOSIS — R42 Dizziness and giddiness: Secondary | ICD-10-CM | POA: Diagnosis present

## 2021-02-12 DIAGNOSIS — I160 Hypertensive urgency: Secondary | ICD-10-CM

## 2021-02-12 DIAGNOSIS — J189 Pneumonia, unspecified organism: Secondary | ICD-10-CM

## 2021-02-12 DIAGNOSIS — I10 Essential (primary) hypertension: Secondary | ICD-10-CM

## 2021-02-12 DIAGNOSIS — Z20822 Contact with and (suspected) exposure to covid-19: Secondary | ICD-10-CM | POA: Diagnosis not present

## 2021-02-12 DIAGNOSIS — N3000 Acute cystitis without hematuria: Secondary | ICD-10-CM

## 2021-02-12 DIAGNOSIS — R791 Abnormal coagulation profile: Secondary | ICD-10-CM | POA: Diagnosis not present

## 2021-02-12 DIAGNOSIS — Z79899 Other long term (current) drug therapy: Secondary | ICD-10-CM | POA: Diagnosis not present

## 2021-02-12 HISTORY — DX: Dizziness and giddiness: R42

## 2021-02-12 LAB — DIFFERENTIAL
Abs Immature Granulocytes: 0.05 10*3/uL (ref 0.00–0.07)
Basophils Absolute: 0 10*3/uL (ref 0.0–0.1)
Basophils Relative: 0 %
Eosinophils Absolute: 0.2 10*3/uL (ref 0.0–0.5)
Eosinophils Relative: 2 %
Immature Granulocytes: 1 %
Lymphocytes Relative: 31 %
Lymphs Abs: 2.6 10*3/uL (ref 0.7–4.0)
Monocytes Absolute: 0.5 10*3/uL (ref 0.1–1.0)
Monocytes Relative: 6 %
Neutro Abs: 4.9 10*3/uL (ref 1.7–7.7)
Neutrophils Relative %: 60 %

## 2021-02-12 LAB — CBC
HCT: 40.8 % (ref 36.0–46.0)
Hemoglobin: 12.3 g/dL (ref 12.0–15.0)
MCH: 25.3 pg — ABNORMAL LOW (ref 26.0–34.0)
MCHC: 30.1 g/dL (ref 30.0–36.0)
MCV: 84 fL (ref 80.0–100.0)
Platelets: 261 10*3/uL (ref 150–400)
RBC: 4.86 MIL/uL (ref 3.87–5.11)
RDW: 12.9 % (ref 11.5–15.5)
WBC: 8.2 10*3/uL (ref 4.0–10.5)
nRBC: 0 % (ref 0.0–0.2)

## 2021-02-12 LAB — URINALYSIS, ROUTINE W REFLEX MICROSCOPIC
Bilirubin Urine: NEGATIVE
Glucose, UA: NEGATIVE mg/dL
Ketones, ur: NEGATIVE mg/dL
Nitrite: POSITIVE — AB
Protein, ur: 30 mg/dL — AB
Specific Gravity, Urine: 1.013 (ref 1.005–1.030)
pH: 6 (ref 5.0–8.0)

## 2021-02-12 LAB — COMPREHENSIVE METABOLIC PANEL
ALT: 27 U/L (ref 0–44)
AST: 23 U/L (ref 15–41)
Albumin: 4 g/dL (ref 3.5–5.0)
Alkaline Phosphatase: 60 U/L (ref 38–126)
Anion gap: 8 (ref 5–15)
BUN: 13 mg/dL (ref 8–23)
CO2: 23 mmol/L (ref 22–32)
Calcium: 9.8 mg/dL (ref 8.9–10.3)
Chloride: 103 mmol/L (ref 98–111)
Creatinine, Ser: 0.65 mg/dL (ref 0.44–1.00)
GFR, Estimated: >60 mL/min (ref >60)
Glucose, Bld: 159 mg/dL — ABNORMAL HIGH (ref 70–99)
Potassium: 4 mmol/L (ref 3.5–5.1)
Sodium: 134 mmol/L — ABNORMAL LOW (ref 135–145)
Total Bilirubin: 0.5 mg/dL (ref 0.3–1.2)
Total Protein: 8.2 g/dL — ABNORMAL HIGH (ref 6.5–8.1)

## 2021-02-12 LAB — APTT: aPTT: 29 seconds (ref 24–36)

## 2021-02-12 LAB — PROTIME-INR
INR: 1 (ref 0.8–1.2)
Prothrombin Time: 12.6 seconds (ref 11.4–15.2)

## 2021-02-12 LAB — RESP PANEL BY RT-PCR (FLU A&B, COVID) ARPGX2
Influenza A by PCR: NEGATIVE
Influenza B by PCR: NEGATIVE
SARS Coronavirus 2 by RT PCR: NEGATIVE

## 2021-02-12 MED ORDER — SODIUM CHLORIDE 0.9 % IV BOLUS
500.0000 mL | Freq: Once | INTRAVENOUS | Status: AC
  Administered 2021-02-12: 500 mL via INTRAVENOUS

## 2021-02-12 MED ORDER — MECLIZINE HCL 12.5 MG PO TABS: 12.5000 mg | ORAL_TABLET | Freq: Three times a day (TID) | ORAL | 0 refills | Status: DC | PRN

## 2021-02-12 MED ORDER — SODIUM CHLORIDE 0.9% FLUSH
3.0000 mL | Freq: Once | INTRAVENOUS | Status: AC
  Administered 2021-02-12: 3 mL via INTRAVENOUS

## 2021-02-12 MED ORDER — AZITHROMYCIN 250 MG PO TABS: 250.0000 mg | ORAL_TABLET | Freq: Every day | ORAL | 0 refills | Status: DC

## 2021-02-12 MED ORDER — ONDANSETRON HCL 4 MG PO TABS: 4.0000 mg | ORAL_TABLET | Freq: Four times a day (QID) | ORAL | 0 refills | Status: DC

## 2021-02-12 MED ORDER — AMOXICILLIN-POT CLAVULANATE 875-125 MG PO TABS: 1.0000 | ORAL_TABLET | Freq: Two times a day (BID) | ORAL | 0 refills | Status: DC

## 2021-02-12 MED ORDER — HYDRALAZINE HCL 20 MG/ML IJ SOLN
5.0000 mg | Freq: Once | INTRAMUSCULAR | Status: AC
  Administered 2021-02-12: 5 mg via INTRAVENOUS
  Filled 2021-02-12: qty 1

## 2021-02-12 NOTE — ED Provider Notes (Signed)
[facility] EMERGENCY DEPARTMENT Provider Note   CSN: [id number] Arrival date & time: 02/12/21  1418     History No chief complaint on file.   Kaylee Lynn is a 60 y.o. female with history of hypertension, vertigo, diabetes, obesity.  Patient presents to the ER today for evaluation of dizziness.  Patient reports she first noticed dizziness yesterday while sitting at home she had a brief spell of room spinning sensation which she attributed to her known history of vertigo.  Patient reports that she felt better upon waking up today, she went downstairs to eat breakfast when her symptoms again began suddenly.  She describes room spinning sensation without vision changes headache numbness, tingling, difficulty speaking or any additional concerns.  She went to an urgent care and was told that she was hypertensive and was sent here for further evaluation.  Patient reports that she took her blood pressure medications this morning.  Patient reports her dizziness has gradually improved since leaving the urgent care.  Patient has fever/chills, fall/injury, headache, vision changes, difficulty speaking, neck pain/stiffness, chest pain/shortness of breath, abdominal pain, nausea/vomiting, diarrhea, numbness/ting, weakness or any additional concerns.  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     Family History  Problem Relation Age of Onset   Hypertension Mother    Hypertension Father    CAD Father        CABG   Hypertension Sister    Stroke Brother    Hypertension Brother    Colon cancer Neg Hx    Esophageal cancer Neg Hx    Rectal cancer Neg Hx    Stomach cancer Neg Hx     Social History   Tobacco Use   Smoking status: Never   Smokeless tobacco: Never  Vaping Use   Vaping Use: Never used  Substance Use Topics   Alcohol use: Yes    Alcohol/week: 1.0 standard drink    Types: 1 Glasses of wine per week    Comment: occasional   Drug use: No    Home Medications Prior to Admission medications   Medication Sig Start Date End Date Taking? Authorizing Provider  albuterol (VENTOLIN HFA) 108 (90 Base) MCG/ACT inhaler Inhale 1-2 puffs into the lungs every 6 (six) hours as needed for wheezing or shortness of breath. 11/19/19   ERenato Shin MD  amLODipine (NORVASC) 10 MG tablet TAKE 1 TABLET BY  MOUTH EVERY DAY 02/01/21   Dorna Mai, MD  BD PEN NEEDLE NANO 2ND GEN 32G X 4 MM MISC  04/13/20   [provider]  Dulaglutide (TRULICITY) 3 0000000 SOPN Inject 3 mg as directed once a week. 01/24/21   Renato Shin, MD  glucose blood [facility] VERIO) test strip 1 each by Other route 2 (two) times daily. as directed 11/19/19   Renato Shin, MD  ipratropium (ATROVENT) 0.06 % nasal spray Place 2 sprays into both nostrils 4 (four) times daily. Patient not taking: Reported on 01/11/2021 06/04/20   Ok Edwards, PA-C  magnesium chloride (SLOW-MAG) 64 MG TBEC SR tablet Take 1 tablet by mouth daily.    [provider]  Multiple Vitamins-Minerals (CENTRUM  SILVER 50+WOMEN PO) Take 1 tablet by mouth daily.    [provider]  olmesartan (BENICAR) 40 MG tablet Take 1 tablet (40 mg total) by mouth daily. 01/11/21   Skeet Latch, MD  Omega-3 Fatty Acids (OMEGA 3 PO) Take 1 tablet by mouth.    [provider]  Jonetta Speak LANCETS [id number] MISC USE AS DIRECTED TWICE A DAY 10/23/17   Renato Shin, MD  spironolactone (ALDACTONE) 25 MG tablet Take 1 tablet (25 mg total) by mouth daily. 11/15/20 02/13/21  Skeet Latch, MD  triamcinolone (KENALOG) 0.1 % APPLY 1 APPLICATION 3 (THREE) TIMES DAILY AS NEEDED FOR ITCHING OR RASH 08/23/20   Camillia Herter, NP  vitamin C (ASCORBIC ACID) 500 MG tablet Take 500 mg by mouth daily.    [provider]    Allergies    Patient has no known allergies.  Review of Systems   Review of Systems Ten systems are reviewed and are negative for acute change except as noted in the HPI  Physical Exam Updated Vital Signs BP (!) 203/86   Pulse 75   Temp 98 F (36.7 C) (Oral)   Resp 16   LMP 07/16/2012   SpO2 100%   Physical Exam Constitutional:      General: She is not in acute distress.    Appearance: Normal appearance. She is well-developed. She is not ill-appearing or diaphoretic.  HENT:     Head: Normocephalic and atraumatic.  Eyes:     General: Vision grossly intact. Gaze aligned appropriately.     Pupils: Pupils are equal, round, and reactive to light.  Neck:     Trachea: Trachea and phonation normal.  Pulmonary:     Effort: Pulmonary effort is normal. No respiratory distress.  Abdominal:     General: There is no distension.     Palpations: Abdomen is soft.     Tenderness: There is no abdominal tenderness. There is no guarding or rebound.  Musculoskeletal:        General: Normal range of motion.     Cervical back: Normal range of motion.  Skin:    General: Skin is warm and dry.  Neurological:     Mental Status: She is alert.     GCS: GCS eye subscore is 4. GCS verbal  subscore is 5. GCS motor subscore is 6.     Comments: Speech is clear and goal oriented, follows commands Major Cranial nerves without deficit, no facial droop Normal strength in upper and lower extremities bilaterally including dorsiflexion and plantar flexion, strong and equal grip strength Sensation normal to light and sharp touch Moves extremities without ataxia, coordination intact Normal finger to nose and rapid alternating movements Neg romberg, no pronator drift Normal gait Normal heel-shin  and balance  Psychiatric:        Behavior: Behavior normal.    ED Results / Procedures / Treatments   Labs (all labs ordered are listed, but only abnormal results are displayed) Labs Reviewed  CBC - Abnormal; Notable for the following components:      Result Value   MCH 25.3 (*)    All other components within normal limits  PROTIME-INR  APTT  DIFFERENTIAL  COMPREHENSIVE METABOLIC PANEL  URINALYSIS, ROUTINE W REFLEX MICROSCOPIC    EKG None  Radiology No results found.  Procedures Procedures   Medications Ordered in ED Medications  sodium chloride 0.9 % bolus 500 mL (has no administration in time range)  sodium chloride flush (NS) 0.9 % injection 3 mL (3 mLs Intravenous Given 02/12/21 1500)    ED Course  I have reviewed the triage vital signs and the nursing notes.  Pertinent labs & imaging results that were available during my care of the patient were reviewed by me and considered in my medical decision making (see chart for details).    MDM Rules/Calculators/A&P                           Additional history obtained from: Nursing notes from this visit. Review of electronic medical records. --------- Patient presented today for evaluation of dizziness and hypertension.  She was seen at urgent care prior to arrival.  She reports she took her blood pressure medication.  Patient reports that her dizziness which she describes as a room spinning sensation has improved and  is now minimal upon my exam.  Neuro exam is within normal limits today.  She is hypertensive here in the ER.  Case was discussed with attending physician Dr. Roderic Palau, plan of care is basic labs, CT head and 5 mg IV hydralazine for blood pressure control.   I ordered, reviewed and interpreted labs which include: CMP without emergent electrolyte derangement, AKI, LFT elevations or gap. INR within normal limits. CBC without leukocytosis to suggest infection, anemia or thrombocytopenia.  CXR:  IMPRESSION:  Right lower lobe infiltrate concerning for pneumonia.  --- Patient without respiratory symptoms, will add on COVID test.  Care handoff given to [facility], PA-C at shift change.  Plan of care is to follow-up on CT head, labs, blood pressure and reassess patient's symptoms.  Final disposition per oncoming team.  Note: Portions of this report may have been transcribed using voice recognition software. Every effort was made to ensure accuracy; however, inadvertent computerized transcription errors may still be present.  Final Clinical Impression(s) / ED Diagnoses Final diagnoses:  None    Rx / DC Orders ED Discharge Orders     None        Gari Crown 02/12/21 1601    Milton Ferguson, MD 02/13/21 [phone]

## 2021-02-12 NOTE — ED Triage Notes (Signed)
Pt sent from [facility].  Reports sudden onset of dizziness, generalized weakness, and nausea that started at 11am this morning.  No arm drift.  History of vertigo in February that she used to take Meclizine for.  Pt unable to walk from chair to triage room due to severe dizziness.

## 2021-02-12 NOTE — ED Provider Notes (Signed)
[place]   MRN: [id number] DOB: 1960-07-10  Subjective:   Kaylee Lynn is a 60 y.o. female presenting for acute onset this morning of dizziness, vertigo, difficulty with her balance.  Symptoms started develop in a.m. when she was sitting on the couch watching TV.  Reports that she has had a similar episode in the past and was diagnosed with vertigo, resolved on its own.  This would however like to vomiting and even though she feels a little bit better in terms of her dizziness and vertigo still has an unsteady feeling.  Denies headache, confusion, weakness on one side of body, numbness or tingling, chest pain, heart racing, diaphoresis, abdominal pain.  Has not taken medication for relief.  She does have a history of high blood pressure. First had an episode last night that resolved on its own.   No current facility-administered medications for this encounter.  Current Outpatient Medications:    albuterol (VENTOLIN HFA) 108 (90 Base) MCG/ACT inhaler, Inhale 1-2 puffs into the lungs every 6 (six) hours as needed for wheezing or shortness of breath., Disp: 1 g, Rfl: 0   amLODipine (NORVASC) 10 MG tablet, TAKE 1 TABLET BY MOUTH EVERY DAY, Disp: 90 tablet, Rfl: 0   BD PEN NEEDLE NANO 2ND GEN 32G X 4 MM MISC, , Disp: , Rfl:    Dulaglutide (TRULICITY) 3 0000000 SOPN, Inject 3 mg as directed once a week., Disp: 6 mL, Rfl: 3   glucose blood (ONETOUCH VERIO) test strip, 1 each by Other route 2 (two) times daily. as directed, Disp: 180 strip, Rfl: 3   ipratropium (ATROVENT) 0.06 % nasal spray, Place 2 sprays into both nostrils 4 (four) times daily. (Patient not taking: Reported on 01/11/2021), Disp: 15 mL, Rfl: 0   magnesium chloride (SLOW-MAG) 64 MG TBEC SR tablet, Take 1 tablet by mouth daily., Disp: , Rfl:    Multiple Vitamins-Minerals (CENTRUM SILVER 50+WOMEN PO), Take 1 tablet by mouth daily., Disp: , Rfl:    olmesartan (BENICAR) 40 MG tablet, Take 1 tablet (40 mg total) by  mouth daily., Disp: 30 tablet, Rfl: 6   Omega-3 Fatty Acids (OMEGA 3 PO), Take 1 tablet by mouth., Disp: , Rfl:    ONETOUCH DELICA LANCETS 99991111 MISC, USE AS DIRECTED TWICE A DAY, Disp: 100 each, Rfl: 8   spironolactone (ALDACTONE) 25 MG tablet, Take 1 tablet (25 mg total) by mouth daily., Disp: 90 tablet, Rfl: 3   triamcinolone (KENALOG) 0.1 %, APPLY 1 APPLICATION 3 (THREE) TIMES DAILY AS NEEDED FOR ITCHING OR RASH, Disp: 80 g, Rfl: 0   vitamin C (ASCORBIC ACID) 500 MG tablet, Take 500 mg by mouth daily., Disp: , Rfl:    No Known Allergies  Past Medical History:  Diagnosis Date   Allergy    Anemia    history   Bronchitis    Hx - uses inhaler prn   Hypertension    Type I (juvenile type) diabetes mellitus without mention of complication, not stated as uncontrolled    per patient type 2      Past Surgical History:  Procedure Laterality Date   left eye surgery     at age 85yr, weak muscle   missed abortion     elective     Family History  Problem Relation Age of Onset   Hypertension Mother    Hypertension Father    CAD Father        CABG   Hypertension Sister    Stroke  Brother    Hypertension Brother    Colon cancer Neg Hx    Esophageal cancer Neg Hx    Rectal cancer Neg Hx    Stomach cancer Neg Hx     Social History   Tobacco Use   Smoking status: Never   Smokeless tobacco: Never  Vaping Use   Vaping Use: Never used  Substance Use Topics   Alcohol use: Yes    Alcohol/week: 1.0 standard drink    Types: 1 Glasses of wine per week    Comment: occasional   Drug use: No    ROS   Objective:   Vitals: BP (!) 205/97 (BP Location: Left Arm)   Pulse 73   Temp 97.9 F (36.6 C) (Oral)   Resp 18   LMP 07/16/2012   SpO2 97%   BP 195/88 on recheck by PA-Mayson Sterbenz.   BP Readings from Last 3 Encounters:  02/12/21 (!) 205/97  01/11/21 (!) 148/88  11/15/20 (!) 210/84   Physical Exam Constitutional:      General: She is not in acute distress.    Appearance:  Normal appearance. She is well-developed. She is not ill-appearing, toxic-appearing or diaphoretic.  HENT:     Head: Normocephalic and atraumatic.     Nose: Nose normal.     Mouth/Throat:     Mouth: Mucous membranes are moist.     Pharynx: Oropharynx is clear.  Eyes:     General: No scleral icterus.       Right eye: No discharge.        Left eye: No discharge.     Conjunctiva/sclera: Conjunctivae normal.     Pupils: Pupils are equal, round, and reactive to light.  Cardiovascular:     Rate and Rhythm: Normal rate.  Pulmonary:     Effort: Pulmonary effort is normal.  Skin:    General: Skin is warm and dry.  Neurological:     General: No focal deficit present.     Mental Status: She is alert and oriented to person, place, and time.     Cranial Nerves: No cranial nerve deficit.     Motor: No weakness.     Coordination: Coordination normal.     Gait: Gait normal.     Deep Tendon Reflexes: Reflexes normal.     Comments: Negative Romberg and pronator drift.  No facial asymmetry.  Psychiatric:        Mood and Affect: Mood normal.        Behavior: Behavior normal.        Thought Content: Thought content normal.        Judgment: Judgment normal.     Assessment and Plan :   PDMP not reviewed this encounter.  1. Hypertensive urgency   2. Dizziness   3. Essential hypertension     Had extensive discussion with patient about the severe nature of having a possible stroke or acute encephalopathy.  Patient verbalized understanding, refused transport by EMS.  States that she wants her nephew to drive her to the hospital, verbalizes understanding the risks of an uncontrolled acute encephalopathy, stroke, TIA versus aneurysm.  Contracts for safety and will go to the hospital now.   Jaynee Eagles, PA-C 02/12/21 1359

## 2021-02-12 NOTE — Discharge Instructions (Addendum)
I am concerned that you are having an acute encephalopathy, TIA or stroke, aneurysm due to your dizziness, nausea and vomiting because of the high blood pressure you have been our clinic today.  We have readings of 205/97 and 195/88.  You have refused transport to the hospital by ambulance/EMS.  However, it is very important that you had to the hospital for further evaluation and intervention than we could provide in the urgent care setting.  Please have your nephew drive you to the hospital now.

## 2021-02-12 NOTE — Discharge Instructions (Addendum)
Take meclizine as needed for vertigo up to 3 times daily as needed.  If you start feeling nauseated you can take the Zofran every 6 hours as needed.   You also have a pneumonia.  Please take Augmentin twice daily for the next 7 days.  Also take the first 2 tablets of azithromycin today and then take 1 tabletdaily until finished.  You will finish this before he finished the Augmentin.  Please see your primary care doctor tomorrow or the day after her blood pressure recheck.  Blood pressure was elevated here in the ED, and while it went down and there were no signs of hypertensive urgency is important by your blood pressure is better controlled.

## 2021-02-12 NOTE — ED Provider Notes (Signed)
Patient care assumed from Hosie Spangle, PA-C at shift change.  Please see his note for complete history and more detailed physical exam findings.  Patient reported to the ED from urgent care for vertigo and hypertensive urgency.  She was given hydralazine here in the ED, work-up for signs of renal dysfunction.  Urine is still pending, CT head still pending.  Her vertigo has resolved independently of medical intervention.    Physical Exam  BP (!) 186/79   Pulse 78   Temp 98 F (36.7 C) (Oral)   Resp (!) 25   LMP 07/16/2012   SpO2 100%   Physical Exam Vitals and nursing note reviewed. Exam conducted with a chaperone present.  Constitutional:      General: She is not in acute distress.    Appearance: Normal appearance.  HENT:     Head: Normocephalic and atraumatic.  Eyes:     General: No scleral icterus.    Extraocular Movements: Extraocular movements intact.     Pupils: Pupils are equal, round, and reactive to light.  Cardiovascular:     Rate and Rhythm: Normal rate and regular rhythm.  Pulmonary:     Effort: Pulmonary effort is normal.     Breath sounds: Normal breath sounds.  Skin:    Coloration: Skin is not jaundiced.  Neurological:     General: No focal deficit present.     Mental Status: She is alert. Mental status is at baseline.     Coordination: Coordination normal.    ED Course/Procedures   Clinical Course as of 02/12/21 1843  Sun Feb 12, 2021  1647 Patient ambulatory to the bathroom without difficulty.  States there has been improvement to the vertigo. [HS]  [id number] Comprehensive metabolic panel(!) No electrolyte derangement, no anion gap, no renal impairment  [HS]  1656 CBC(!) No leukocytosis, no anemia [HS]  1657 DG Chest Portable 1 View Findings consistent with a right lower lobe pneumonia.  We will start the patient on antibiotics.  She denies any symptoms that would be suggestive of pneumonia-she is not short of breath, she is not coughing, does not have any  fevers.  [HS]  1700 CT HEAD WO CONTRAST No acute abnormalities of the CT head concerning for [facility] [HS]  1700 Resp(!): 25 Rechecked: 18 [HS]  1700 Urinalysis, Routine w reflex microscopic Urine, Clean Catch(!) UTI - will treat with ABX. No proteinuria consistent with HTN crisis.  [HS]  [id number] Patient is already being prescribed Augmentin for pneumonia.  Although not first-line, this should provide adequate coverage for the UTI.  Additionally, she is not having any urinary symptoms and is not over the age of 58 with risk factors for complicated UTI. [HS]    Clinical Course User Index [HS] Sherrill Raring, PA-C    Procedures  MDM  Please see ED course for interpretation of labs and imaging as relevant.  CT without findings concerning for [facility].  No electrolyte derangement, CBC without leukocytosis or anemia.  No signs of renal impairment.  UA without findings consistent with proteinuria.  Blood pressure has had a significant percentage decrease since arrival, she is appropriate for discharge.  I do not suspect hypertensive urgency or emergency or hypertensive crisis requiring admission or additional work-up.  Patient appears to have a pneumonia, will start antibiotics.  We will also give symptomatic management of for vertigo with meclizine and Zofran for nausea.    Patient has used both in the past and is aware of side effects and risks.  Discussed again and patient verbalized understanding.  She agrees to see her primary care doctor in 2 days for blood pressure recheck.  Return precautions given and agreed upon.    Sherrill Raring, PA-C 02/12/21 1843    Milton Ferguson, MD 02/13/21 [phone]

## 2021-02-12 NOTE — ED Triage Notes (Signed)
Pt here for dizziness and N/V x 1 today; sx started last night worse this am; pt sts hx of vertigo; pt noted hypertensive sts taking meds

## 2021-02-13 DIAGNOSIS — I10 Essential (primary) hypertension: Secondary | ICD-10-CM

## 2021-02-15 LAB — URINE CULTURE: Culture: >=100000 — AB

## 2021-02-16 ENCOUNTER — Telehealth: Payer: Self-pay | Admitting: *Deleted

## 2021-02-16 NOTE — Telephone Encounter (Signed)
Post ED Visit - Positive Culture Follow-up  Culture report reviewed by antimicrobial stewardship pharmacist: [place] Team '[]'$  Elenor Quinones, Pharm.D. '[]'$  Heide Guile, Pharm.D., BCPS AQ-ID '[]'$  Parks Neptune, Pharm.D., BCPS '[]'$  Alycia Rossetti, Pharm.D., BCPS '[]'$  [place], Pharm.D., BCPS, AAHIVP '[]'$  Legrand Como, Pharm.D., BCPS, AAHIVP '[]'$  Salome Arnt, PharmD, BCPS '[]'$  Johnnette Gourd, PharmD, BCPS '[]'$  [place] Better, PharmD, BCPS '[]'$  Leeroy Cha, PharmD '[]'$  Laqueta Linden, PharmD, BCPS '[]'$  Albertina Parr, PharmD  [place] Team '[]'$  Leodis Sias, PharmD '[]'$  Lindell Spar, PharmD '[]'$  Royetta Asal, PharmD '[]'$  Graylin Shiver, Rph '[]'$  Rema Fendt) Glennon Mac, PharmD '[]'$  Arlyn Dunning, PharmD '[]'$  Netta Cedars, PharmD '[]'$  Dia Sitter, PharmD '[]'$  Leone Haven, PharmD '[]'$  Gretta Arab, PharmD '[]'$  Theodis Shove, PharmD '[]'$  Peggyann Juba, PharmD '[]'$  Reuel Boom, PharmD   Positive urine culture Treated with Amoxicillin-Pot Clavulanate, organism sensitive to the same and no further patient follow-up is required at this time.  Alfonzo Feller, PharmD  Harlon Flor Talley 02/16/2021, 9:28 AM

## 2021-02-21 ENCOUNTER — Telehealth: Payer: Self-pay

## 2021-02-21 DIAGNOSIS — Z0000 Encounter for general adult medical examination without abnormal findings: Secondary | ICD-10-CM

## 2021-02-21 NOTE — Telephone Encounter (Signed)
Called patient to discuss health coaching regarding patient's responses to [place] pathway questions. Left a message for patient to return call to Care Guide at [phone].

## 2021-02-22 ENCOUNTER — Encounter: Payer: Self-pay | Admitting: Gastroenterology

## 2021-02-22 ENCOUNTER — Other Ambulatory Visit: Payer: Self-pay

## 2021-02-22 ENCOUNTER — Ambulatory Visit
Payer: No Typology Code available for payment source | Admitting: Pharmacist Clinician (PhC)/ Clinical Pharmacy Specialist

## 2021-02-22 VITALS — BP 194/88 | HR 74 | Resp 18 | Ht 59.0 in | Wt 220.0 lb

## 2021-02-22 DIAGNOSIS — I10 Essential (primary) hypertension: Secondary | ICD-10-CM | POA: Diagnosis not present

## 2021-02-22 MED ORDER — CHLORTHALIDONE 25 MG PO TABS: 25.0000 mg | ORAL_TABLET | Freq: Every day | ORAL | 6 refills | Status: DC

## 2021-02-22 NOTE — Patient Instructions (Signed)
Return for a a follow up appointment Wednesday Sept 28 at 3 pm  Go to the lab in 2 weeks to check kidney function  Check your blood pressure at home daily and keep record of the readings.  Take your BP meds as follows:  Start chlorthalidone 25 mg.  Take 1/2 tablet daily for the first 6 days then increase to 1 tablet daily  Continue with all other medications  Bring all of your meds, your BP cuff and your record of home blood pressures to your next appointment.  Exercise as you're able, try to walk approximately 30 minutes per day.  Keep salt intake to a minimum, especially watch canned and prepared boxed foods.  Eat more fresh fruits and vegetables and fewer canned items.  Avoid eating in fast food restaurants.    HOW TO TAKE YOUR BLOOD PRESSURE: Rest 5 minutes before taking your blood pressure.  Don't smoke or drink caffeinated beverages for at least 30 minutes before. Take your blood pressure before (not after) you eat. Sit comfortably with your back supported and both feet on the floor (don't cross your legs). Elevate your arm to heart level on a table or a desk. Use the proper sized cuff. It should fit smoothly and snugly around your bare upper arm. There should be enough room to slip a fingertip under the cuff. The bottom edge of the cuff should be 1 inch above the crease of the elbow. Ideally, take 3 measurements at one sitting and record the average.

## 2021-02-22 NOTE — Assessment & Plan Note (Signed)
Patient with resistant hypertension, now on 3 medications.  Will add chlorthalidone 25 mg daily to her regimen. She should continue with the [place] home monitoring.  Will have her return in 2 weeks for repeat metabolic panel then in 1 month for follow up.  Pressure elevated today as she is still recovering from pneumonia, states it wore her out just getting from car to office today.

## 2021-02-22 NOTE — Progress Notes (Signed)
02/22/2021 DOREN LAMOTTE Apr 28, 1961 [id number]   HPI:  Kaylee Lynn is a 60 y.o. female patient of Dr Oval Linsey, who presents today for advanced hypertension clinic follow up.  In addition to hypertension her medical history is significant for  DM2 (with A1c 6.4 earlier this year).  She was referred by Durene Fruits NP for uncontrolled hypertension and when she saw Dr. Oval Linsey her pressure was noted to be 210/84.  Patient reported that she was diagnosed in her 79's and it had been steadily increasing for the past few years.  At the time of her visit she was on losartan hctz 100/25 mg and had just filled a 90 day supply.  She was also on amlodipine 10 mg, so Dr. Oval Linsey added spironolactone 25 mg daily.  A metabolic panel drawn after a week showed kidney function and potassium levels stable.  She was set up with a [employer] home monitor and asked to check home BP readings once or twice daily.  Since then we have had some improvement in her pressure, although not yet to goal.    Today she returns for follow up.  Since her last visit she was in the ED for hypertensive urgency, which was then noted to be a mix of pneumonia, vertigo and UTI.  Pressure was up to 205/97.  She was placed on Augmentin, and has about 3 days remaining.  States she is feeling somewhat better, but not back to her baseline.    Blood Pressure Goal:  130/80  Current Medications: amlodipine 10 mg qd, olmesartan 40 qd, spironolactone 25 mg qd  Family Hx: both parents with hypertension, father with CABG  Social Hx: no tobacco, no alcohol, now drinking decaf after first cup of coffee (3 total); occasional soda  Diet: mix of eating out and home cooked; was eating out some but noticed increase in weight, so working back to mostly home cooked; trying to cut back on sodium; loves vegetables - about 5 servings per day (mostly fresh, sometimes frozen); protein is mostly chicken and beef  Exercise: walks at park, walks dog (50+  pound), has been called by PREP - will hopefully be starting soon  Home BP readings:  from [place] - last 2 weeks (with pneumonia) averaged 152/86  Intolerances: knda  Labs: 6/22:  Na 137, K 4.7, Glu 93, BUN 20, SCr 0.64, GFR 100   Wt Readings from Last 3 Encounters:  02/22/21 220 lb (99.8 kg)  01/11/21 225 lb (102.1 kg)  11/15/20 217 lb (98.4 kg)   BP Readings from Last 3 Encounters:  02/22/21 (!) 194/88  02/12/21 (!) 176/75  02/12/21 (!) 205/97   Pulse Readings from Last 3 Encounters:  02/22/21 74  02/12/21 81  02/12/21 73    Current Outpatient Medications  Medication Sig Dispense Refill   albuterol (VENTOLIN HFA) 108 (90 Base) MCG/ACT inhaler Inhale 1-2 puffs into the lungs every 6 (six) hours as needed for wheezing or shortness of breath. 1 g 0   amLODipine (NORVASC) 10 MG tablet TAKE 1 TABLET BY MOUTH EVERY DAY 90 tablet 0   amoxicillin-clavulanate (AUGMENTIN) 875-125 MG tablet Take 1 tablet by mouth every 12 (twelve) hours. 14 tablet 0   azithromycin (ZITHROMAX) 250 MG tablet Take 1 tablet (250 mg total) by mouth daily. Take first 2 tablets together, then 1 every day until finished. 6 tablet 0   BD PEN NEEDLE NANO 2ND GEN 32G X 4 MM MISC      chlorthalidone (HYGROTON)  25 MG tablet Take 1 tablet (25 mg total) by mouth daily. 30 tablet 6   Dulaglutide (TRULICITY) 3 0000000 SOPN Inject 3 mg as directed once a week. (Patient taking differently: Inject 3 mg as directed once a week. Every monday) 6 mL 3   glucose blood (ONETOUCH VERIO) test strip 1 each by Other route 2 (two) times daily. as directed 180 strip 3   ipratropium (ATROVENT) 0.06 % nasal spray Place 2 sprays into both nostrils 4 (four) times daily. 15 mL 0   magnesium chloride (SLOW-MAG) 64 MG TBEC SR tablet Take 1 tablet by mouth daily.     meclizine (ANTIVERT) 12.5 MG tablet Take 1 tablet (12.5 mg total) by mouth 3 (three) times daily as needed for dizziness. 30 tablet 0   Multiple Vitamins-Minerals (CENTRUM SILVER  50+WOMEN PO) Take 1 tablet by mouth daily.     olmesartan (BENICAR) 40 MG tablet Take 1 tablet (40 mg total) by mouth daily. 30 tablet 6   Omega-3 Fatty Acids (OMEGA 3 PO) Take 1 tablet by mouth.     ondansetron (ZOFRAN) 4 MG tablet Take 1 tablet (4 mg total) by mouth every 6 (six) hours. 12 tablet 0   ONETOUCH DELICA LANCETS 99991111 MISC USE AS DIRECTED TWICE A DAY 100 each 8   spironolactone (ALDACTONE) 25 MG tablet Take 1 tablet (25 mg total) by mouth daily. 90 tablet 3   triamcinolone (KENALOG) 0.1 % APPLY 1 APPLICATION 3 (THREE) TIMES DAILY AS NEEDED FOR ITCHING OR RASH 80 g 0   vitamin C (ASCORBIC ACID) 500 MG tablet Take 500 mg by mouth daily.     No current facility-administered medications for this visit.    No Known Allergies  Past Medical History:  Diagnosis Date   Allergy    Anemia    history   Bronchitis    Hx - uses inhaler prn   Hypertension    Type I (juvenile type) diabetes mellitus without mention of complication, not stated as uncontrolled    per patient type 2    Vertigo     Blood pressure (!) 194/88, pulse 74, resp. rate 18, height '4\' 11"'$  (1.499 m), weight 220 lb (99.8 kg), last menstrual period 07/16/2012, SpO2 99 %.  Resistant hypertension Patient with resistant hypertension, now on 3 medications.  Will add chlorthalidone 25 mg daily to her regimen. She should continue with the [place] home monitoring.  Will have her return in 2 weeks for repeat metabolic panel then in 1 month for follow up.  Pressure elevated today as she is still recovering from pneumonia, states it wore her out just getting from car to office today.     Tommy Medal PharmD CPP [facility] [address] [place], [place] [address] [phone]

## 2021-02-24 ENCOUNTER — Telehealth: Payer: Self-pay

## 2021-02-24 NOTE — Telephone Encounter (Signed)
VMT pt requesting call back to discuss which PREP class she would like to take.  Daytime? Evening?

## 2021-03-03 ENCOUNTER — Other Ambulatory Visit: Payer: Self-pay | Admitting: Family

## 2021-03-03 DIAGNOSIS — L309 Dermatitis, unspecified: Secondary | ICD-10-CM

## 2021-03-09 ENCOUNTER — Telehealth: Payer: Self-pay

## 2021-03-09 NOTE — Telephone Encounter (Signed)
LMOM FOR LABS TO BE COMPLETED PRIOR TO APPT W/PHARMD FOR HTN

## 2021-03-15 ENCOUNTER — Ambulatory Visit: Payer: No Typology Code available for payment source | Admitting: Endocrinology

## 2021-03-15 DIAGNOSIS — I10 Essential (primary) hypertension: Secondary | ICD-10-CM

## 2021-03-22 ENCOUNTER — Ambulatory Visit: Payer: No Typology Code available for payment source

## 2021-03-24 ENCOUNTER — Ambulatory Visit: Payer: No Typology Code available for payment source | Admitting: Pharmacist

## 2021-03-24 ENCOUNTER — Other Ambulatory Visit: Payer: Self-pay

## 2021-03-24 VITALS — BP 164/80 | HR 78

## 2021-03-24 DIAGNOSIS — Z13228 Encounter for screening for other metabolic disorders: Secondary | ICD-10-CM | POA: Insufficient documentation

## 2021-03-24 DIAGNOSIS — I10 Essential (primary) hypertension: Secondary | ICD-10-CM

## 2021-03-24 MED ORDER — NEBIVOLOL HCL 5 MG PO TABS: 5.0000 mg | ORAL_TABLET | Freq: Every day | ORAL | 2 refills | Status: DC

## 2021-03-24 NOTE — Patient Instructions (Addendum)
It was nice meeting you  We would like your blood pressure to be less than 130/80  Continue your spironolactone 25mg  daily Continue chlorthalidone 25mg  daily Continue olmesartan 40mg  daily Continue amlodipine 10mg  daily  We will add a new medication called nebivolol 5mg  once a day  Continue to monitor your blood pressure at home.  If it remains elevated we can begin to increase the new medication  Please call with any issues  Karren Cobble, PharmD, BCACP, [place], [place] [address] [place], [place] [address] Phone: [phone]; Fax: [phone] 03/24/2021 12:12 PM

## 2021-03-24 NOTE — Progress Notes (Signed)
Patient ID: Kaylee Lynn                 DOB: 09-14-1960                      MRN: [id number]     HPI: Kaylee Lynn is a 60 y.o. female referred by Dr. Oval Linsey to HTN clinic. PMH is significant for resistant HTN, T2DM and obesity.  Patient has a history of resistant HTN and at last visit chlorthalidone 25mg  was added to regimen.  Since last visit with PharmD, patient fell and sprained both of her great toes.  They were both bandaged and she was unable to walk.  Patient has a varied work schedule.  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  Therefore she reports she does not take her medications at the same time every day.  However, she reports compliance and has had no adverse effects to chlorthalidone or other antihypertensives.  Has been checking BP 2-3 times daily via Vivify.  Average for last month 150/75.  Was enrolled in a program called VIR[facility] for her T2DM.  Last year her A1c was >15 and has now reduced it to 6.4.  Has appt with PCP soon to recheck.  Realized however that the diet plan Michaell Cowing endorsed was a high salt ketogenic diet so she has stopped.    Home readings are done on her right wrist   Current HTN meds:  chlorthalidone 25mg  daily,  amlodipine 10mg  daily,  olmesartan 40mg  daily,  spironolactone 25mg  daily  Previously tried: lisinopril BP goal: <130/80   Wt Readings from Last 3 Encounters:  02/22/21 220 lb (99.8 kg)  01/11/21 225 lb (102.1 kg)  11/15/20 217 lb (98.4 kg)   BP Readings from Last 3 Encounters:  02/22/21 (!) 194/88  02/12/21 (!) 176/75  02/12/21 (!) 205/97   Pulse Readings from Last 3 Encounters:  02/22/21 74  02/12/21 81  02/12/21 73    Renal function: CrCl cannot be calculated (Patient's most recent lab result is older than the maximum 21 days allowed.).  Past Medical History:  Diagnosis Date   Allergy    Anemia    history   Bronchitis    Hx - uses  inhaler prn   Hypertension    Type I (juvenile type) diabetes mellitus without mention of complication, not stated as uncontrolled    per patient type 2    Vertigo     Current Outpatient Medications on File Prior to Visit  Medication Sig Dispense Refill   albuterol (VENTOLIN HFA) 108 (90 Base) MCG/ACT inhaler Inhale 1-2 puffs into the lungs every 6 (six) hours as needed for wheezing or shortness of breath. 1 g 0   amLODipine (NORVASC) 10 MG tablet TAKE 1 TABLET BY MOUTH EVERY DAY 90 tablet 0   amoxicillin-clavulanate (AUGMENTIN) 875-125 MG tablet Take 1 tablet by mouth every 12 (twelve) hours. 14 tablet 0   azithromycin (ZITHROMAX) 250 MG tablet Take 1 tablet (250 mg total) by mouth daily. Take first 2 tablets together, then 1 every day until finished. 6 tablet 0   BD PEN NEEDLE NANO 2ND GEN 32G X 4 MM MISC      chlorthalidone (HYGROTON) 25 MG tablet Take 1 tablet (25 mg total) by mouth daily. 30 tablet 6   Dulaglutide (TRULICITY) 3 ZY/6.0YT SOPN Inject 3 mg as directed once a week. (Patient taking differently:  No sig reported) 6 mL 3   glucose blood (ONETOUCH VERIO) test strip 1 each by Other route 2 (two) times daily. as directed 180 strip 3   ipratropium (ATROVENT) 0.06 % nasal spray Place 2 sprays into both nostrils 4 (four) times daily. 15 mL 0   magnesium chloride (SLOW-MAG) 64 MG TBEC SR tablet Take 1 tablet by mouth daily.     meclizine (ANTIVERT) 12.5 MG tablet Take 1 tablet (12.5 mg total) by mouth 3 (three) times daily as needed for dizziness. 30 tablet 0   Multiple Vitamins-Minerals (CENTRUM SILVER 50+WOMEN PO) Take 1 tablet by mouth daily.     olmesartan (BENICAR) 40 MG tablet Take 1 tablet (40 mg total) by mouth daily. 30 tablet 6   Omega-3 Fatty Acids (OMEGA 3 PO) Take 1 tablet by mouth.     ondansetron (ZOFRAN) 4 MG tablet Take 1 tablet (4 mg total) by mouth every 6 (six) hours. 12 tablet 0   ONETOUCH DELICA LANCETS 02M MISC USE AS DIRECTED TWICE A DAY 100 each 8    spironolactone (ALDACTONE) 25 MG tablet Take 1 tablet (25 mg total) by mouth daily. 90 tablet 3   triamcinolone cream (KENALOG) 0.1 % APPLY 1 APPLICATION 3 (THREE) TIMES DAILY AS NEEDED FOR ITCHING OR RASH 30 g 1   vitamin C (ASCORBIC ACID) 500 MG tablet Take 500 mg by mouth daily.     No current facility-administered medications on file prior to visit.    No Known Allergies   Assessment/Plan:  1. Hypertension -  BP in room using Adv Htn electronic machine 174/82.  Rechecked manually: 164/80. Patient reports she took all of her medications this morning.  Both readings above goal of <130/80 and higher than her typical home readings.    Patient currently on 4 classes of antihypertensives at high dosages and remains above goal.  Will add nebivolol 5mg  since patient currently not taking a beta blocker and for evidence of efficacy in African Americans. Can be titrated up to 40mg  daily.  Will recheck in 4 weeks.  Continue spironolactone 25mg  daily Continue chlorthalidone 25mg  daily Continue amlodipine 10mg  daily Continue olmesartan 40mg  daily Start nebivolol 5mg  daily Recheck in 4 weeks  Karren Cobble, PharmD, BCACP, [place], [place] [address] [place], [place] [address] Phone: [phone]; Fax: [phone] 03/24/2021 2:37 PM

## 2021-04-19 ENCOUNTER — Ambulatory Visit: Payer: No Typology Code available for payment source

## 2021-04-26 ENCOUNTER — Telehealth: Payer: Self-pay

## 2021-04-26 DIAGNOSIS — Z0000 Encounter for general adult medical examination without abnormal findings: Secondary | ICD-10-CM

## 2021-04-26 NOTE — Telephone Encounter (Signed)
Called patient to inform her that her bp is no longer being monitored with Vivify remotely. Patient answered the phone and hung up before introducing self. Will attempt to call patient back by the end of the business week on 04/28/21.  Semya Klinke Truman Hayward, [facility] Guide, Health Coach [address] [place] [address] Telephone: [phone] Email: [email or web address]

## 2021-04-26 NOTE — Telephone Encounter (Signed)
Patient called in to reschedule pharmacy appointment cancelled on 10/26. Patient stated that she had death in the family. Patient was also informed that she was called earlier today to instruct her to keep a written log of her bp readings because we are no longer monitoring her with Vivify remotely. Patient stated that she has been keeping screen shots of her readings for record to bring to future appointments. Patient was transferred to pharmacy and spoke with [facility] to reschedule appointment.    Daulton Harbaugh Truman Hayward, [facility] Guide, Health Coach [address] [place] [address] Telephone: [phone] Email: [email or web address]

## 2021-05-10 ENCOUNTER — Ambulatory Visit: Payer: No Typology Code available for payment source | Admitting: Endocrinology

## 2021-05-12 ENCOUNTER — Telehealth: Payer: Self-pay

## 2021-05-12 NOTE — Telephone Encounter (Signed)
LVMT pt requesting call back to discuss next PREP class starting 06/06/21 T/TH 615p-730P at [facility]

## 2021-05-15 ENCOUNTER — Other Ambulatory Visit: Payer: Self-pay

## 2021-05-15 ENCOUNTER — Ambulatory Visit
Payer: No Typology Code available for payment source | Admitting: Pharmacist Clinician (PhC)/ Clinical Pharmacy Specialist

## 2021-05-15 DIAGNOSIS — I10 Essential (primary) hypertension: Secondary | ICD-10-CM

## 2021-05-15 NOTE — Assessment & Plan Note (Signed)
Patient with resistant, and likely some measure of white coat, hypertension.  Home readings are much lower than in office.  Asked that she bring home cuff to next appointment so we can verify accuracy.  For now she will continue with current medications and we will see her back in 3 months for follow up.  She is aware to contact the office should home BP readings exceed 618 systolic.

## 2021-05-15 NOTE — Progress Notes (Signed)
05/15/2021 SHARLENE Lynn Mar 04, 1961 [id number]   HPI:  Kaylee Lynn is a 60 y.o. female patient of Dr Oval Linsey, who presents today for advanced hypertension clinic follow up.  In addition to hypertension her medical history is significant for  DM2 (with A1c 6.4 earlier this year).  She was referred by Durene Fruits NP for uncontrolled hypertension and when she saw Dr. Oval Linsey her pressure was noted to be 210/84.  Patient reported that she was diagnosed in her 76's and it had been steadily increasing for the past few years.  At the time of her visit she was on losartan hctz 100/25 mg and had just filled a 90 day supply.  She was also on amlodipine 10 mg, so Dr. Oval Linsey added spironolactone 25 mg daily.  A metabolic panel drawn after a week showed kidney function and potassium levels stable.  She was set up with a Vivify home monitor and asked to check home BP readings once or twice daily.  Since then we have had some improvement in her pressure, although not yet to goal.  Over several visits her pressure did improve some, but has not yet achieved goal BP.   At her most recent appointment with Karren Cobble PharmD she was at 164/80 and nebivolol 5 mg daily was added.    She returns today for follow up.  She does not have any home BP readings with her today, but was able to determine high and low readings for past 2 weeks based on recall.  She notes she has switched off a keto style diet and is now trying to follow DASH more closely.  Does admit she ran out of amlodipine about 5 days ago, pharmacy states refill request was not answered.      Blood Pressure Goal:  130/80  Current Medications: amlodipine 10 mg qd, olmesartan 40 qd, spironolactone 25 mg qd, chlorthalidone 25 mg qd, nebivolol 5 mg qd  Family Hx: both parents with hypertension, father with CABG  Social Hx: no tobacco, no alcohol, now drinking only decaf coffee (although admits to using whipping cream rather than half and  half)  Diet: mix of eating out and home cooked; notes now trying to more closely follow DASH diet - switched from bacon/eggs to oatmeal and yogurt for breakfast; adding in vegetables at lunch and eating more berries for snacks.   Exercise: walks at park, walks dog (50+ pound), has been called by PREP - will hopefully be starting soon  Home BP readings:  no longer connected to [place], she is aware she should be recording home readings herself.  Notes in past 2 weeks range is roughly 131-148/70-80.    Intolerances: knda  Labs: 6/22:  Na 137, K 4.7, Glu 93, BUN 20, SCr 0.64, GFR 100   Notes home readings much lower  131-148/70-80 (nothing higher than 80)   Wt Readings from Last 3 Encounters:  05/15/21 235 lb 9.6 oz (106.9 kg)  02/22/21 220 lb (99.8 kg)  01/11/21 225 lb (102.1 kg)   BP Readings from Last 3 Encounters:  05/15/21 (!) 170/100  03/24/21 (!) 164/80  02/22/21 (!) 194/88   Pulse Readings from Last 3 Encounters:  05/15/21 79  03/24/21 78  02/22/21 74    Current Outpatient Medications  Medication Sig Dispense Refill   albuterol (VENTOLIN HFA) 108 (90 Base) MCG/ACT inhaler Inhale 1-2 puffs into the lungs every 6 (six) hours as needed for wheezing or shortness of breath. 1 g 0  amLODipine (NORVASC) 10 MG tablet TAKE 1 TABLET BY MOUTH EVERY DAY 90 tablet 0   BD PEN NEEDLE NANO 2ND GEN 32G X 4 MM MISC      chlorthalidone (HYGROTON) 25 MG tablet Take 1 tablet (25 mg total) by mouth daily. 30 tablet 6   Dulaglutide (TRULICITY) 3 QM/5.7QI SOPN Inject 3 mg as directed once a week. (Patient taking differently: Inject 3 mg as directed once a week. Every monday) 6 mL 3   glucose blood (ONETOUCH VERIO) test strip 1 each by Other route 2 (two) times daily. as directed 180 strip 3   ipratropium (ATROVENT) 0.06 % nasal spray Place 2 sprays into both nostrils 4 (four) times daily. 15 mL 0   magnesium chloride (SLOW-MAG) 64 MG TBEC SR tablet Take 1 tablet by mouth daily.      Multiple Vitamins-Minerals (CENTRUM SILVER 50+WOMEN PO) Take 1 tablet by mouth daily.     nebivolol (BYSTOLIC) 5 MG tablet Take 1 tablet (5 mg total) by mouth daily. 30 tablet 2   olmesartan (BENICAR) 40 MG tablet Take 1 tablet (40 mg total) by mouth daily. 30 tablet 6   Omega-3 Fatty Acids (OMEGA 3 PO) Take 1 tablet by mouth.     ondansetron (ZOFRAN) 4 MG tablet Take 1 tablet (4 mg total) by mouth every 6 (six) hours. 12 tablet 0   ONETOUCH DELICA LANCETS 69G MISC USE AS DIRECTED TWICE A DAY 100 each 8   spironolactone (ALDACTONE) 25 MG tablet Take 1 tablet (25 mg total) by mouth daily. 90 tablet 3   triamcinolone cream (KENALOG) 0.1 % APPLY 1 APPLICATION 3 (THREE) TIMES DAILY AS NEEDED FOR ITCHING OR RASH 30 g 1   vitamin C (ASCORBIC ACID) 500 MG tablet Take 500 mg by mouth daily.     No current facility-administered medications for this visit.    No Known Allergies  Past Medical History:  Diagnosis Date   Allergy    Anemia    history   Bronchitis    Hx - uses inhaler prn   Hypertension    Type I (juvenile type) diabetes mellitus without mention of complication, not stated as uncontrolled    per patient type 2    Vertigo     Blood pressure (!) 170/100, pulse 79, resp. rate 15, height 4\' 11"  (1.499 m), weight 235 lb 9.6 oz (106.9 kg), last menstrual period 07/16/2012, SpO2 100 %.  Resistant hypertension Patient with resistant, and likely some measure of white coat, hypertension.  Home readings are much lower than in office.  Asked that she bring home cuff to next appointment so we can verify accuracy.  For now she will continue with current medications and we will see her back in 3 months for follow up.  She is aware to contact the office should home BP readings exceed 295 systolic.     Tommy Medal PharmD CPP [facility] [address] [place], [place] [address] [phone]

## 2021-05-15 NOTE — Patient Instructions (Signed)
Return for a a follow up appointment February 15 at 10:00 am  Check your blood pressure at home daily and keep record of the readings.  Take your BP meds as follows:  Continue with current medications  Bring all of your meds, your BP cuff and your record of home blood pressures to your next appointment.  Exercise as you're able, try to walk approximately 30 minutes per day.  Keep salt intake to a minimum, especially watch canned and prepared boxed foods.  Eat more fresh fruits and vegetables and fewer canned items.  Avoid eating in fast food restaurants.    HOW TO TAKE YOUR BLOOD PRESSURE: Rest 5 minutes before taking your blood pressure.  Don't smoke or drink caffeinated beverages for at least 30 minutes before. Take your blood pressure before (not after) you eat. Sit comfortably with your back supported and both feet on the floor (don't cross your legs). Elevate your arm to heart level on a table or a desk. Use the proper sized cuff. It should fit smoothly and snugly around your bare upper arm. There should be enough room to slip a fingertip under the cuff. The bottom edge of the cuff should be 1 inch above the crease of the elbow. Ideally, take 3 measurements at one sitting and record the average.

## 2021-05-29 ENCOUNTER — Other Ambulatory Visit: Payer: Self-pay | Admitting: Family

## 2021-05-29 DIAGNOSIS — L309 Dermatitis, unspecified: Secondary | ICD-10-CM

## 2021-08-09 ENCOUNTER — Telehealth: Payer: Self-pay

## 2021-08-09 ENCOUNTER — Ambulatory Visit: Payer: No Typology Code available for payment source

## 2021-08-09 NOTE — Telephone Encounter (Signed)
Lmom to r/s

## 2021-09-06 ENCOUNTER — Other Ambulatory Visit: Payer: Self-pay | Admitting: Cardiovascular Disease

## 2021-09-06 NOTE — Telephone Encounter (Signed)
Rx(s) sent to pharmacy electronically.  

## 2021-09-07 ENCOUNTER — Other Ambulatory Visit: Payer: Self-pay | Admitting: Cardiovascular Disease

## 2021-09-07 NOTE — Telephone Encounter (Signed)
Rx(s) sent to pharmacy electronically.  

## 2021-09-24 IMAGING — CT CT HEAD W/O CM
4 series · 17 of 47 positions shown, 19 images · non-contrast
Comparison: None.

CLINICAL DATA: Nonspecific dizziness.

EXAM:
CT HEAD WITHOUT CONTRAST
TECHNIQUE: Contiguous axial images were obtained from the base of the skull
through the vertex without intravenous contrast.

[Series 3: head wo · axial · 0.40mm/px · z∈[-176,-62]mm · 7 of 31 slices shown, 9 images]
[im 4/31  brain]
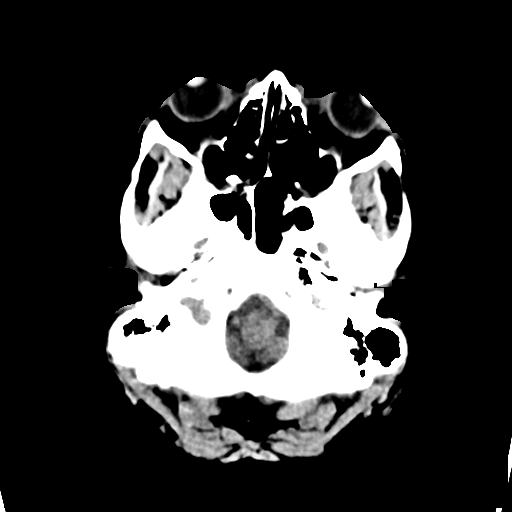
[im 4/31  bone]
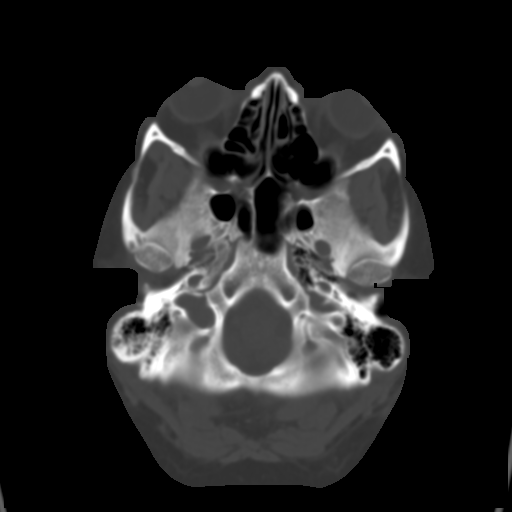
[im 8/31  brain]
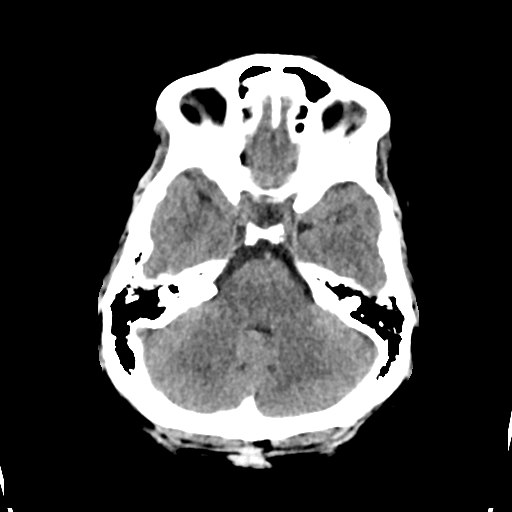
[im 12/31  brain]
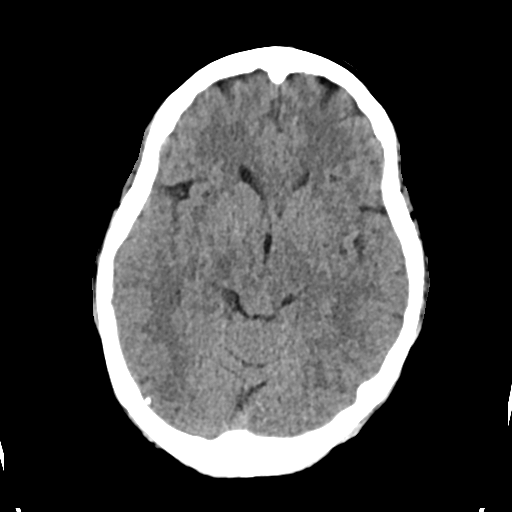
[im 16/31  brain]
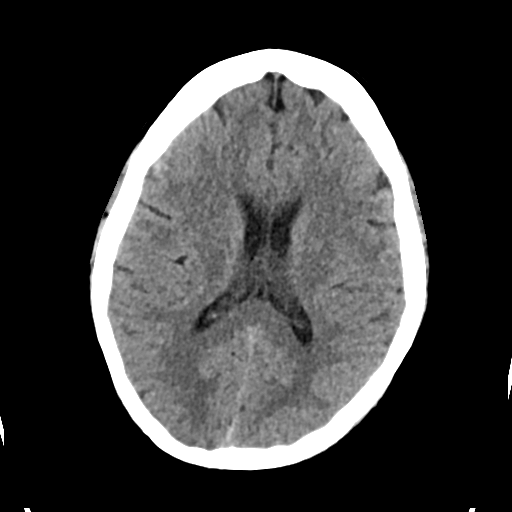
[im 19/31  brain]
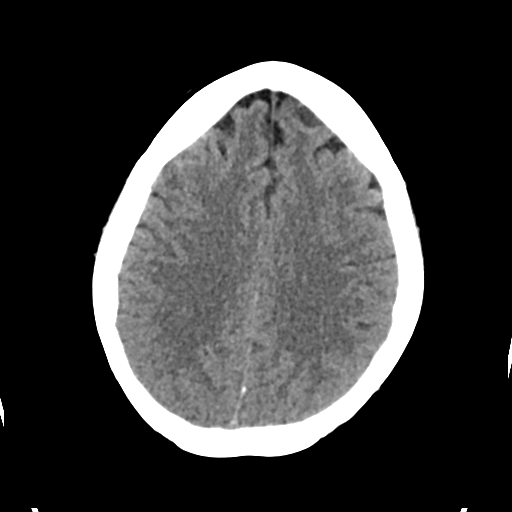
[im 19/31  bone]
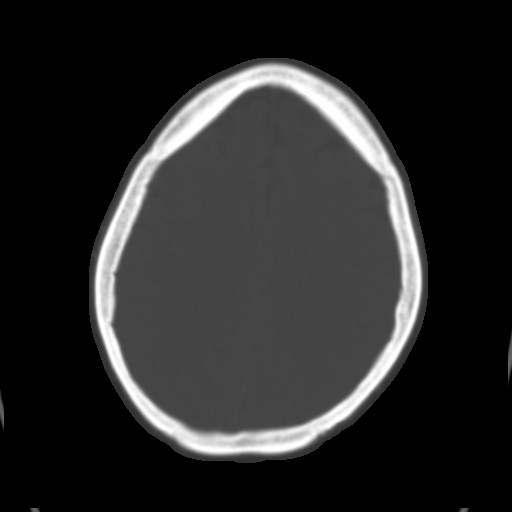
[im 23/31  brain]
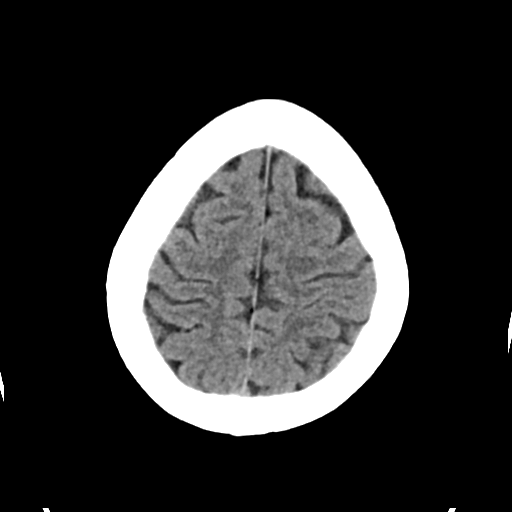
[im 27/31  brain]
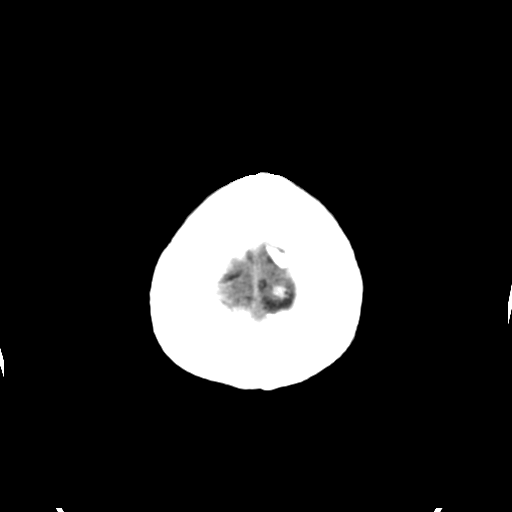

[Series 4: head bone · axial · 0.40mm/px · z∈[-178,-124]mm · 4 of 77 slices shown]
[im 8/77  bone]
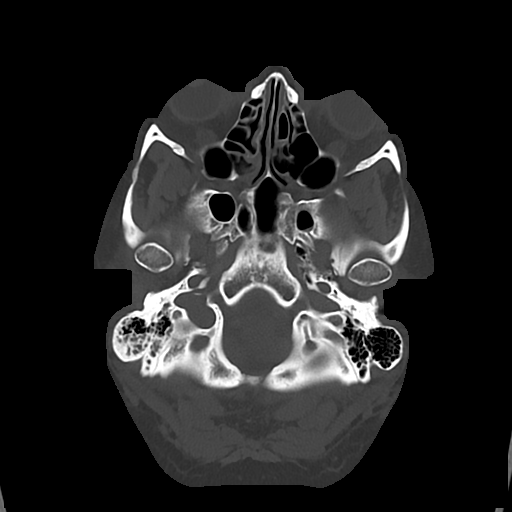
[im 16/77  bone]
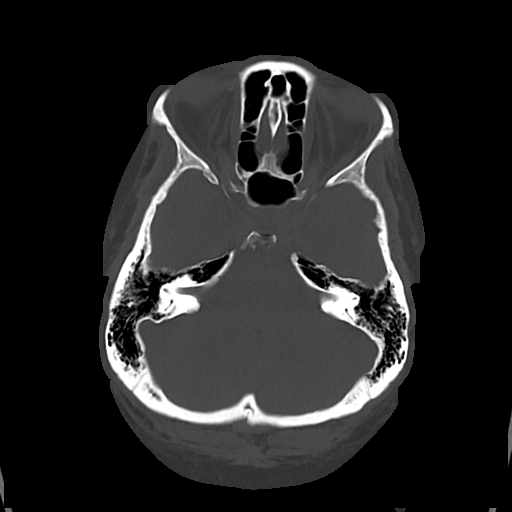
[im 23/77  bone]
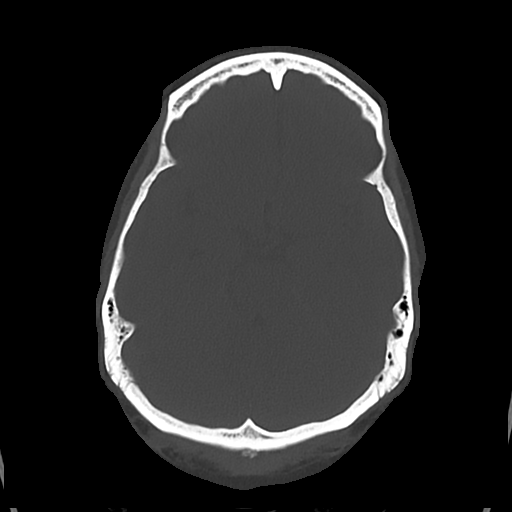
[im 35/77  bone]
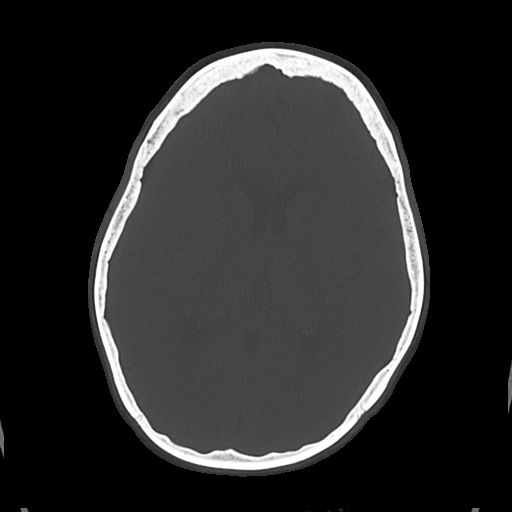

[Series 5: cor soft · coronal · 0.33mm/px · 3 of 63 slices shown]
[im 21/63  brain]
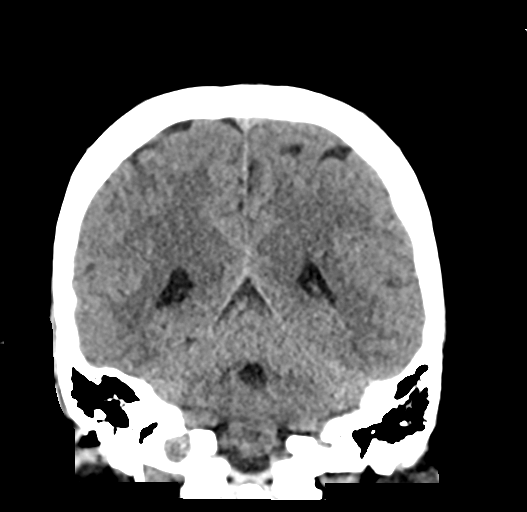
[im 28/63  brain]
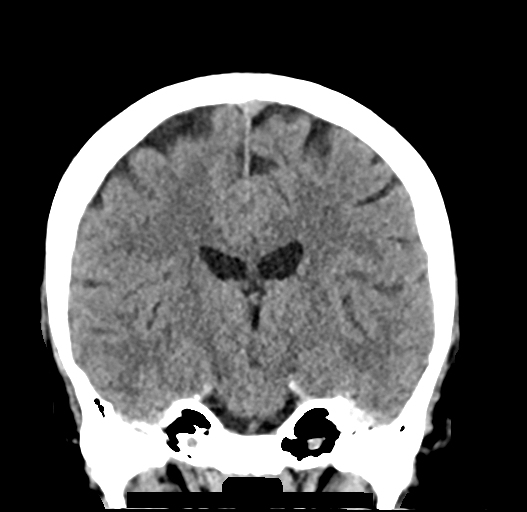
[im 35/63  brain]
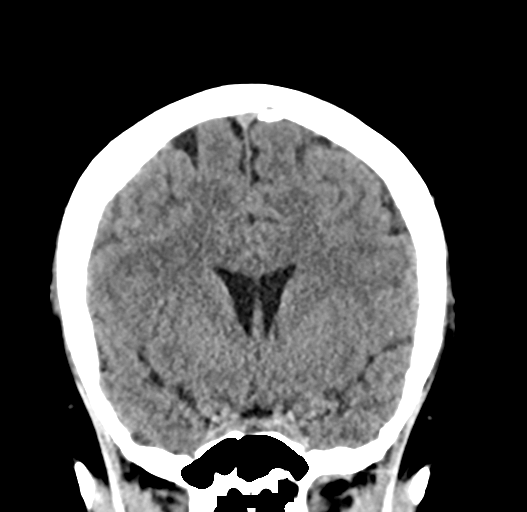

[Series 6: sag soft · sagittal · 0.33mm/px · 3 of 53 slices shown]
[im 18/53  brain]
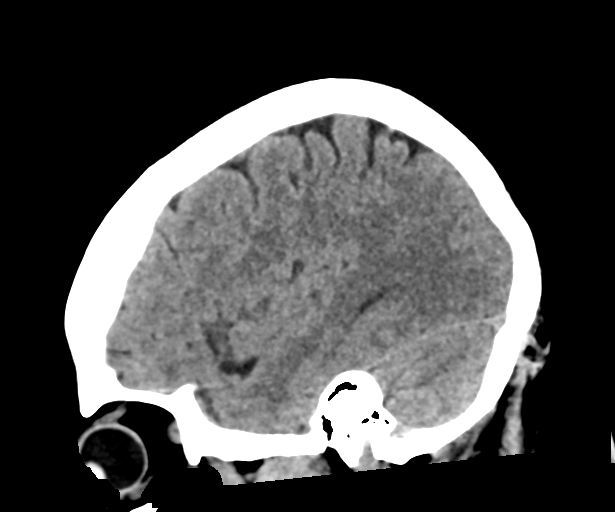
[im 27/53  brain]
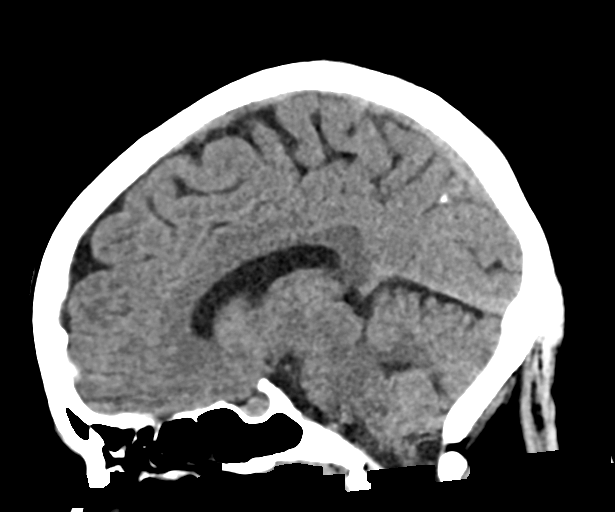
[im 35/53  brain]
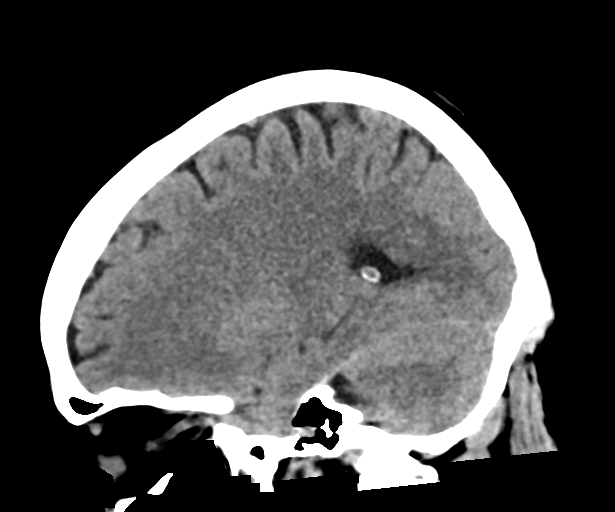

[17 of 47 positions shown; findings below may reference images not displayed]

FINDINGS: Brain: No evidence of acute infarction, hemorrhage, hydrocephalus,
extra-axial collection or mass lesion/mass effect.

Vascular: No hyperdense vessel or unexpected calcification.

Skull: Normal. Negative for fracture or focal lesion.

Sinuses/Orbits: No acute finding.

Other: None.
IMPRESSION: Negative exam

## 2021-10-10 ENCOUNTER — Other Ambulatory Visit: Payer: Self-pay | Admitting: Cardiovascular Disease

## 2021-10-10 NOTE — Telephone Encounter (Signed)
Rx(s) sent to pharmacy electronically.  

## 2021-10-29 ENCOUNTER — Other Ambulatory Visit: Payer: Self-pay | Admitting: Cardiovascular Disease

## 2021-10-30 NOTE — Telephone Encounter (Signed)
Rx(s) sent to pharmacy electronically.  

## 2021-12-09 ENCOUNTER — Other Ambulatory Visit: Payer: Self-pay | Admitting: Cardiovascular Disease

## 2021-12-10 ENCOUNTER — Other Ambulatory Visit: Payer: Self-pay | Admitting: Cardiovascular Disease

## 2021-12-10 DIAGNOSIS — I10 Essential (primary) hypertension: Secondary | ICD-10-CM

## 2021-12-11 NOTE — Telephone Encounter (Signed)
Please call pt to schedule appointment with Dr. Oval Linsey for refills. Thank you!

## 2021-12-11 NOTE — Telephone Encounter (Signed)
Rx request sent to pharmacy.  

## 2021-12-12 ENCOUNTER — Telehealth (HOSPITAL_BASED_OUTPATIENT_CLINIC_OR_DEPARTMENT_OTHER): Payer: Self-pay | Admitting: Cardiovascular Disease

## 2021-12-12 NOTE — Telephone Encounter (Signed)
Called to schedule over due appointment with Dr. Devona Konig, NP for medication refills---no answer and voice mail is full

## 2021-12-12 NOTE — Telephone Encounter (Signed)
Roland Earl   12/12/21 10:34 AM Note Called to schedule over due appointment with Dr. Devona Konig, NP for medication refills---no answer and voice mail is full

## 2021-12-14 NOTE — Telephone Encounter (Signed)
Rx(s) sent to pharmacy electronically.  

## 2021-12-27 ENCOUNTER — Encounter (HOSPITAL_BASED_OUTPATIENT_CLINIC_OR_DEPARTMENT_OTHER): Payer: Self-pay | Admitting: Cardiovascular Disease

## 2021-12-27 NOTE — Telephone Encounter (Signed)
Called to discuss scheduling overdue follow up with Dr. Jorge Mandril answer and mail box is full---will mail letter requesting patient call

## 2022-01-15 ENCOUNTER — Telehealth: Payer: Self-pay

## 2022-01-15 DIAGNOSIS — Z0000 Encounter for general adult medical examination without abnormal findings: Secondary | ICD-10-CM

## 2022-01-15 NOTE — Telephone Encounter (Signed)
Called patient to determine if patient had returned the Vivify device upon completion of the program of if she still has the cuff and need to return it to Dr. Oval Linsey. Left message for patient to return my call and provide update on status of cuff.    Heberto Sturdevant Truman Hayward, [facility] Guide, Health Coach [address] [place] [address] Telephone: [phone] Email: [email or web address]

## 2022-01-17 ENCOUNTER — Other Ambulatory Visit: Payer: Self-pay | Admitting: Cardiovascular Disease

## 2022-10-12 ENCOUNTER — Encounter: Payer: Self-pay | Admitting: Gastroenterology

## 2023-05-20 ENCOUNTER — Ambulatory Visit
Admission: EM | Admit: 2023-05-20 | Discharge: 2023-05-20 | Disposition: A | Discharge status: Home / Self Care | Payer: BLUE CROSS/BLUE SHIELD | Attending: Physician Assistant | Admitting: Physician Assistant

## 2023-05-20 DIAGNOSIS — H538 Other visual disturbances: Secondary | ICD-10-CM | POA: Diagnosis not present

## 2023-05-20 DIAGNOSIS — I10 Essential (primary) hypertension: Secondary | ICD-10-CM

## 2023-05-20 DIAGNOSIS — E113292 Type 2 diabetes mellitus with mild nonproliferative diabetic retinopathy without macular edema, left eye: Secondary | ICD-10-CM | POA: Diagnosis not present

## 2023-05-20 DIAGNOSIS — H5789 Other specified disorders of eye and adnexa: Secondary | ICD-10-CM | POA: Diagnosis not present

## 2023-05-20 DIAGNOSIS — H20041 Secondary noninfectious iridocyclitis, right eye: Secondary | ICD-10-CM | POA: Diagnosis not present

## 2023-05-20 DIAGNOSIS — H4321 Crystalline deposits in vitreous body, right eye: Secondary | ICD-10-CM | POA: Diagnosis not present

## 2023-05-20 DIAGNOSIS — H2101 Hyphema, right eye: Secondary | ICD-10-CM | POA: Diagnosis not present

## 2023-05-20 MED ORDER — AMLODIPINE BESYLATE 10 MG PO TABS: 10.0000 mg | ORAL_TABLET | Freq: Every day | ORAL | 0 refills | Status: DC

## 2023-05-20 MED ORDER — POLYMYXIN B-TRIMETHOPRIM 10000-0.1 UNIT/ML-% OP SOLN: 1.0000 [drp] | Freq: Four times a day (QID) | OPHTHALMIC | 0 refills | Status: AC

## 2023-05-20 MED ORDER — OLMESARTAN MEDOXOMIL 40 MG PO TABS: 40.0000 mg | ORAL_TABLET | Freq: Every day | ORAL | 0 refills | Status: DC

## 2023-05-20 NOTE — Discharge Instructions (Addendum)
As we discussed, the safest thing to do is to go to the emergency room.  Since you did not want to do that I have made an appoint with you with ophthalmology for 215.  Please make sure that you do not miss this appointment.  If anything worsens you need to go to the emergency room immediately.  Your blood pressure is very elevated.  Please restart your amlodipine and olmesartan.  I will contact you if your blood work is abnormal.  Follow-up with your primary care next week as we discussed.  If you develop any chest pain, shortness of breath, headache, vision change, dizziness in the setting of high blood pressure you need to go to the emergency room as we discussed.

## 2023-05-20 NOTE — ED Triage Notes (Addendum)
Patient presents with right eye pain that started Saturday morning, redness started Sunday. Treated with polyethylene glycol 400 0.3% eyedrops with relief of pain.

## 2023-05-20 NOTE — ED Provider Notes (Signed)
[facility]    CSN: [id number] Arrival date & time: 05/20/23  7829      History   Chief Complaint Chief Complaint  Patient presents with   Eye Pain    HPI Kaylee Lynn is a 62 y.o. female.   Patient presents today with a 3-day history of right eye pain and redness.  She reports some blurred vision in her right eye but denies any photophobia, headache, nausea, vomiting.  She initially had some discomfort in the eye that was rated 3/4 but this is since resolved.  She continues to have redness as well as some drainage.  She denies any exposure to fine particulate matter or chemicals.  She has tried lubricating eyedrops without improvement of symptoms.  She wears glasses but does not wear contacts.  She does not have an ophthalmologist that she follows regularly.  She denies any sudden visual disturbance or specific areas in her vision that are abnormal; reports that the right eye is just very blurred.  She denies history of glaucoma or ophthalmologically issue.  She was noted to have very elevated blood pressure on intake and reports that she has not been taking her medication for several months nor has she seen her primary care provider.  She denies any chest pain, shortness of breath, dizziness, focal weakness.    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      Home Medications    Prior to Admission medications   Medication Sig Start Date End Date Taking? Authorizing Provider  trimethoprim-polymyxin b (POLYTRIM) ophthalmic solution Place 1 drop into the right eye every 6 (six) hours. 05/20/23  Yes Yaqub Arney K, PA-C  albuterol (VENTOLIN HFA) 108 (90 Base) MCG/ACT inhaler Inhale 1-2 puffs into the lungs every 6 (six) hours as needed for wheezing or shortness of breath. 11/19/19   Romero Belling, MD  amLODipine (NORVASC) 10 MG tablet Take 1 tablet (10 mg total) by mouth daily. 05/20/23   Dalan Cowger, Noberto Retort, PA-C  BD PEN NEEDLE NANO 2ND GEN 32G X 4 MM MISC  04/13/20   [provider]  chlorthalidone (HYGROTON) 25 MG tablet Take 1 tablet (25 mg total) by mouth daily. NEED APPOINTMENT 10/10/21   Chilton Si, MD  Dulaglutide (TRULICITY) 3 MG/0.5ML SOPN Inject 3 mg as directed once a week. Patient taking  differently: Inject 3 mg as directed once a week. Every monday 01/24/21   Romero Belling, MD  glucose blood [facility] VERIO) test strip 1 each by Other route 2 (two) times daily. as directed 11/19/19   Romero Belling, MD  ipratropium (ATROVENT) 0.06 % nasal spray Place 2 sprays into both nostrils 4 (four) times daily. 06/04/20   Cathie Hoops, Amy V, PA-C  magnesium chloride (SLOW-MAG) 64 MG TBEC SR tablet Take 1 tablet by mouth daily.    [provider]  Multiple Vitamins-Minerals (CENTRUM SILVER 50+WOMEN PO) Take 1 tablet by mouth daily.    [provider]  nebivolol (BYSTOLIC) 5 MG tablet Take 1 tablet (5 mg total) by mouth daily. Please call to  schedule an appointment with Dr. Duke Salvia for refills. 12/11/21   Chilton Si, MD  olmesartan (BENICAR) 40 MG tablet Take 1 tablet (40 mg total) by mouth daily. NEED APPOINTMENT 05/20/23   Diannah Rindfleisch, Noberto Retort, PA-C  Omega-3 Fatty Acids (OMEGA 3 PO) Take 1 tablet by mouth.    [provider]  ondansetron (ZOFRAN) 4 MG tablet Take 1 tablet (4 mg total) by mouth every 6 (six) hours. 02/12/21   Theron Arista, PA-C  ONETOUCH DELICA LANCETS 33G MISC USE AS DIRECTED TWICE A DAY 10/23/17   Romero Belling, MD  spironolactone (ALDACTONE) 25 MG tablet Take 1 tablet (25 mg total) by mouth daily. NEED APPOINTMENT 10/30/21   Chilton Si, MD  triamcinolone cream (KENALOG) 0.1 % APPLY 1 APPLICATION 3 (THREE) TIMES DAILY AS NEEDED FOR ITCHING OR RASH 05/30/21   Georganna Skeans, MD  vitamin C (ASCORBIC ACID) 500 MG tablet Take 500 mg by mouth daily.    [provider]    Family History Family History  Problem Relation Age of Onset   Hypertension Mother    Hypertension Father    CAD Father        CABG   Hypertension Sister    Stroke Brother    Hypertension Brother    Colon cancer Neg Hx    Esophageal cancer Neg Hx    Rectal cancer Neg Hx    Stomach cancer Neg Hx     Social History Social History   Tobacco Use   Smoking status: Never   Smokeless tobacco: Never  Vaping Use   Vaping status: Never Used  Substance Use Topics   Alcohol use: Not Currently    Alcohol/week: 1.0 standard drink of alcohol    Types: 1 Glasses of wine per week    Comment: occasional   Drug use: No     Allergies   Patient has no known allergies.   Review of Systems Review of Systems  Constitutional:  Positive for activity change. Negative for appetite change, fatigue and fever.  Eyes:  Positive for discharge, redness and visual disturbance. Negative for photophobia, pain and itching.  Respiratory:  Negative for shortness of breath.   Cardiovascular:  Negative for chest pain, palpitations and leg  swelling.  Gastrointestinal:  Negative for abdominal pain, diarrhea, nausea and vomiting.  Neurological:  Negative for dizziness, light-headedness and headaches.     Physical Exam Triage Vital Signs ED Triage Vitals  Encounter Vitals Group     BP 05/20/23 1033 (!) 222/128     Systolic BP Percentile --      Diastolic BP Percentile --      Pulse --      Resp 05/20/23 1033 16     Temp 05/20/23 1033 98.5 F (36.9 C)  Temp Source 05/20/23 1033 Oral     SpO2 05/20/23 1033 95 %     Weight 05/20/23 1032 184 lb (83.5 kg)     Height 05/20/23 1032 4\' 11"  (1.499 m)     Head Circumference --      Peak Flow --      Pain Score 05/20/23 1031 1     Pain Loc --      Pain Education --      Exclude from Growth Chart --    No data found.  Updated Vital Signs BP (!) 201/83 (BP Location: Right Arm)   Pulse 83   Temp 98.5 F (36.9 C) (Oral)   Resp 16   Ht 4\' 11"  (1.499 m)   Wt 184 lb (83.5 kg)   LMP 07/16/2012   SpO2 95%   BMI 37.16 kg/m   Visual Acuity Right Eye Distance: 20/70 Left Eye Distance: 20/30 Bilateral Distance: 20/70 (corrected)  Right Eye Near:   Left Eye Near:    Bilateral Near:     Physical Exam Vitals reviewed.  Constitutional:      General: She is awake. She is not in acute distress.    Appearance: Normal appearance. She is well-developed. She is not ill-appearing.     Comments: Very pleasant female appears stated age in no acute distress sitting comfortably in exam room  HENT:     Head: Normocephalic and atraumatic.     Nose: Nose normal.  Eyes:     Extraocular Movements: Extraocular movements intact.     Conjunctiva/sclera:     Right eye: Right conjunctiva is injected. Chemosis present.     Left eye: Left conjunctiva is not injected. No chemosis.    Pupils: Pupils are equal, round, and reactive to light.     Right eye: No corneal abrasion or fluorescein uptake. Seidel exam negative.     Comments: Exotropia on left  Cardiovascular:     Rate and  Rhythm: Normal rate and regular rhythm.     Heart sounds: Normal heart sounds, S1 normal and S2 normal. No murmur heard. Pulmonary:     Effort: Pulmonary effort is normal.     Breath sounds: Normal breath sounds. No wheezing, rhonchi or rales.     Comments: Clear to auscultation bilaterally Psychiatric:        Behavior: Behavior is cooperative.      UC Treatments / Results  Labs (all labs ordered are listed, but only abnormal results are displayed) Labs Reviewed  BASIC METABOLIC PANEL    EKG   Radiology No results found.  Procedures Procedures (including critical care time)  Medications Ordered in UC Medications - No data to display  Initial Impression / Assessment and Plan / UC Course  I have reviewed the triage vital signs and the nursing notes.  Pertinent labs & imaging results that were available during my care of the patient were reviewed by me and considered in my medical decision making (see chart for details).     Patient is well-appearing, afebrile, nontoxic, nontachycardic.  Given she had some blurred vision with elevated blood pressure we discussed that the safest thing to do is to go to the emergency room, however, patient reported that she would not do this.  We did discuss that this could result in permanent blindness/visual disturbance but it is already been going on for several days and patient reported that she was not willing to go to the ER as this is her baseline blood pressure since  she is been without her medication for several months.  She did have visual disturbance that I but this did improve some after application of tetracaine.  She reports that the pain has since resolved.  Since she is unwilling to go to the ER I contacted ophthalmologist office on-call (Dr. Maryagnes Amos at [facility]) and was able to schedule her an appointment for 215 this afternoon.  She was informed of this and agreeable to be evaluated by them this afternoon.  We did  discuss that if her vision worsens or if anything changes she must go to the ER to which she did express understanding.  Her blood pressure is very elevated.  We discussed the importance of going to the emergency room patient declined to do this as this is her baseline blood pressure.  She has been without her medication for several months.  Will restart olmesartan and amlodipine.  BMP was obtained to monitor kidney function and we will contact her if this is abnormal.  She is to avoid decongestants, caffeine, sodium, NSAIDs.  We discussed the importance of going to the ER with any worsening or changing symptoms.  She does have a primary care appointment on 05/29/2023 and was strongly encouraged to keep this appointment.  Final Clinical Impressions(s) / UC Diagnoses   Final diagnoses:  Redness of right eye  Blurred vision, right eye  Elevated blood pressure reading in office with diagnosis of hypertension     Discharge Instructions      As we discussed, the safest thing to do is to go to the emergency room.  Since you did not want to do that I have made an appoint with you with ophthalmology for 215.  Please make sure that you do not miss this appointment.  If anything worsens you need to go to the emergency room immediately.  Your blood pressure is very elevated.  Please restart your amlodipine and olmesartan.  I will contact you if your blood work is abnormal.  Follow-up with your primary care next week as we discussed.  If you develop any chest pain, shortness of breath, headache, vision change, dizziness in the setting of high blood pressure you need to go to the emergency room as we discussed.     ED Prescriptions     Medication Sig Dispense Auth. Provider   amLODipine (NORVASC) 10 MG tablet Take 1 tablet (10 mg total) by mouth daily. 90 tablet Shylo Dillenbeck K, PA-C   trimethoprim-polymyxin b (POLYTRIM) ophthalmic solution Place 1 drop into the right eye every 6 (six) hours. 10 mL  Lucendia Leard K, PA-C   olmesartan (BENICAR) 40 MG tablet Take 1 tablet (40 mg total) by mouth daily. NEED APPOINTMENT 90 tablet Linnaea Ahn K, PA-C      PDMP not reviewed this encounter.   Jeani Hawking, PA-C 05/20/23 1229

## 2023-05-21 LAB — BASIC METABOLIC PANEL
BUN/Creatinine Ratio: 15 (ref 12–28)
BUN: 10 mg/dL (ref 8–27)
CO2: 21 mmol/L (ref 20–29)
Calcium: 10 mg/dL (ref 8.7–10.3)
Chloride: 99 mmol/L (ref 96–106)
Creatinine, Ser: 0.68 mg/dL (ref 0.57–1.00)
Glucose: 349 mg/dL — ABNORMAL HIGH (ref 70–99)
Potassium: 4.5 mmol/L (ref 3.5–5.2)
Sodium: 139 mmol/L (ref 134–144)
eGFR: 97 mL/min/{1.73_m2} (ref >59)

## 2023-05-27 DIAGNOSIS — H2101 Hyphema, right eye: Secondary | ICD-10-CM | POA: Diagnosis not present

## 2023-05-27 DIAGNOSIS — H20041 Secondary noninfectious iridocyclitis, right eye: Secondary | ICD-10-CM | POA: Diagnosis not present

## 2023-05-29 ENCOUNTER — Telehealth: Payer: Self-pay

## 2023-05-29 ENCOUNTER — Encounter: Payer: Self-pay | Admitting: Nurse Practitioner

## 2023-05-29 ENCOUNTER — Ambulatory Visit (INDEPENDENT_AMBULATORY_CARE_PROVIDER_SITE_OTHER): Payer: BLUE CROSS/BLUE SHIELD | Admitting: Nurse Practitioner

## 2023-05-29 ENCOUNTER — Other Ambulatory Visit: Payer: Self-pay

## 2023-05-29 VITALS — BP 196/76 | HR 90 | Temp 97.9°F | Resp 12 | Ht 59.0 in | Wt 191.0 lb

## 2023-05-29 DIAGNOSIS — E118 Type 2 diabetes mellitus with unspecified complications: Secondary | ICD-10-CM

## 2023-05-29 DIAGNOSIS — Z13220 Encounter for screening for lipoid disorders: Secondary | ICD-10-CM

## 2023-05-29 DIAGNOSIS — E1165 Type 2 diabetes mellitus with hyperglycemia: Secondary | ICD-10-CM | POA: Diagnosis not present

## 2023-05-29 DIAGNOSIS — I1A0 Resistant hypertension: Secondary | ICD-10-CM

## 2023-05-29 LAB — POCT GLYCOSYLATED HEMOGLOBIN (HGB A1C): Hemoglobin A1C: 15 % — AB (ref 4.0–5.6)

## 2023-05-29 MED ORDER — CLONIDINE HCL 0.1 MG PO TABS: 0.1000 mg | ORAL_TABLET | Freq: Once | ORAL | Status: DC

## 2023-05-29 MED ORDER — CLONIDINE HCL 0.1 MG PO TABS
0.1000 mg | ORAL_TABLET | Freq: Once | ORAL | Status: AC
  Administered 2023-05-29: 0.1 mg via ORAL

## 2023-05-29 MED ORDER — AMLODIPINE BESYLATE 10 MG PO TABS: 10.0000 mg | ORAL_TABLET | Freq: Every day | ORAL | 0 refills | Status: DC

## 2023-05-29 MED ORDER — TRULICITY 3 MG/0.5ML ~~LOC~~ SOAJ: 3.0000 mg | SUBCUTANEOUS | 3 refills | Status: DC

## 2023-05-29 MED ORDER — METFORMIN HCL ER 500 MG PO TB24: 500.0000 mg | ORAL_TABLET | Freq: Every day | ORAL | 2 refills | Status: DC

## 2023-05-29 NOTE — Addendum Note (Signed)
Addended by: Ivonne Andrew on: 05/29/2023 10:25 AM   Modules accepted: Orders

## 2023-05-29 NOTE — Addendum Note (Signed)
Addended by: Renelda Loma on: 05/29/2023 10:34 AM   Modules accepted: Orders

## 2023-05-29 NOTE — Addendum Note (Signed)
Addended by: Ivonne Andrew on: 05/29/2023 10:33 AM   Modules accepted: Orders

## 2023-05-29 NOTE — Telephone Encounter (Signed)
Pharmacy Patient Advocate Encounter  Received notification from [facility] that Prior Authorization for TRULICITY has been APPROVED from 05/29/2023 to 05/28/2026   PA #/Case ID/Reference #: [id number]

## 2023-05-29 NOTE — Patient Instructions (Signed)
1. Type 2 diabetes mellitus with unspecified complications (HCC)  - Ambulatory referral to Endocrinology - CBC - Comprehensive metabolic panel - metFORMIN (GLUCOPHAGE-XR) 500 MG 24 hr tablet; Take 1 tablet (500 mg total) by mouth daily with breakfast.  Dispense: 60 tablet; Refill: 2  2. Resistant hypertension  - amLODipine (NORVASC) 10 MG tablet; Take 1 tablet (10 mg total) by mouth daily.  Dispense: 90 tablet; Refill: 0 - Ambulatory referral to Cardiology - CBC - Comprehensive metabolic panel  3. Type 2 diabetes mellitus with hyperglycemia, without long-term current use of insulin (HCC)  - Dulaglutide (TRULICITY) 3 MG/0.5ML SOAJ; Inject 3 mg as directed once a week.  Dispense: 6 mL; Refill: 3  4. Lipid screening  - Lipid Panel  Follow up:  Follow up in 1 month to make referrals have been set up and patient is taking medications

## 2023-05-29 NOTE — Progress Notes (Signed)
Subjective   Patient ID: Kaylee Lynn, female    DOB: [birth year], 62 y.o.   MRN: [id number]  Chief Complaint  Patient presents with   Establish Care    Referring provider: Rema Fendt, NP  Kaylee Lynn is a 62 y.o. female with Past Medical History: No date: Allergy No date: Anemia     Comment:  history No date: Bronchitis     Comment:  Hx - uses inhaler prn No date: Hypertension No date: Type I (juvenile type) diabetes mellitus without mention of  complication, not stated as uncontrolled     Comment:  per patient type 2  No date: Vertigo  HPI  Patient presents today to establish care.  She has not been seen by a PCP in over a year.  She has been out of her medications for the past year.  She was previously followed by endocrinology for diabetes and cardiology for resistant hypertension.  We will place referrals back to cardiology and endocrinology today.  A1c in office today was greater than 15.  We will refill Trulicity and start metformin twice daily.  We will refill amlodipine today. Denies f/c/s, n/v/d, hemoptysis, PND, leg swelling Denies chest pain or edema     No Known Allergies  Immunization History  Administered Date(s) Administered   Influenza,inj,Quad PF,6+ Mos 07/20/2016   PFIZER(Purple Top)SARS-COV-2 Vaccination 08/24/2019, 09/16/2019   Pneumococcal Polysaccharide-23 09/22/2013   Td 09/24/1995   Tdap 07/20/2016    Tobacco History: Social History   Tobacco Use  Smoking Status Never  Smokeless Tobacco Never   Counseling given: Not Answered   Outpatient Encounter Medications as of 05/29/2023  Medication Sig   metFORMIN (GLUCOPHAGE-XR) 500 MG 24 hr tablet Take 1 tablet (500 mg total) by mouth daily with breakfast.   olmesartan (BENICAR) 40 MG tablet Take 1 tablet (40 mg total) by mouth daily. NEED APPOINTMENT   prednisoLONE acetate (PRED FORTE) 1 % ophthalmic suspension 1 drop 4 (four) times daily.   trimethoprim-polymyxin b (POLYTRIM)  ophthalmic solution Place 1 drop into the right eye every 6 (six) hours.   albuterol (VENTOLIN HFA) 108 (90 Base) MCG/ACT inhaler Inhale 1-2 puffs into the lungs every 6 (six) hours as needed for wheezing or shortness of breath. (Patient not taking: Reported on 05/29/2023)   amLODipine (NORVASC) 10 MG tablet Take 1 tablet (10 mg total) by mouth daily.   BD PEN NEEDLE NANO 2ND GEN 32G X 4 MM MISC  (Patient not taking: Reported on 05/29/2023)   chlorthalidone (HYGROTON) 25 MG tablet Take 1 tablet (25 mg total) by mouth daily. NEED APPOINTMENT (Patient not taking: Reported on 05/29/2023)   Dulaglutide (TRULICITY) 3 MG/0.5ML SOAJ Inject 3 mg as directed once a week.   glucose blood (ONETOUCH VERIO) test strip 1 each by Other route 2 (two) times daily. as directed (Patient not taking: Reported on 05/29/2023)   ipratropium (ATROVENT) 0.06 % nasal spray Place 2 sprays into both nostrils 4 (four) times daily. (Patient not taking: Reported on 05/29/2023)   magnesium chloride (SLOW-MAG) 64 MG TBEC SR tablet Take 1 tablet by mouth daily. (Patient not taking: Reported on 05/29/2023)   Multiple Vitamins-Minerals (CENTRUM SILVER 50+WOMEN PO) Take 1 tablet by mouth daily. (Patient not taking: Reported on 05/29/2023)   nebivolol (BYSTOLIC) 5 MG tablet Take 1 tablet (5 mg total) by mouth daily. Please call to schedule an appointment with Dr. Duke Salvia for refills. (Patient not taking: Reported on 05/29/2023)   Omega-3 Fatty Acids (OMEGA 3  PO) Take 1 tablet by mouth. (Patient not taking: Reported on 05/29/2023)   ondansetron (ZOFRAN) 4 MG tablet Take 1 tablet (4 mg total) by mouth every 6 (six) hours. (Patient not taking: Reported on 05/29/2023)   ONETOUCH DELICA LANCETS 33G MISC USE AS DIRECTED TWICE A DAY (Patient not taking: Reported on 05/29/2023)   spironolactone (ALDACTONE) 25 MG tablet Take 1 tablet (25 mg total) by mouth daily. NEED APPOINTMENT (Patient not taking: Reported on 05/29/2023)   triamcinolone cream (KENALOG) 0.1  % APPLY 1 APPLICATION 3 (THREE) TIMES DAILY AS NEEDED FOR ITCHING OR RASH (Patient not taking: Reported on 05/29/2023)   vitamin C (ASCORBIC ACID) 500 MG tablet Take 500 mg by mouth daily. (Patient not taking: Reported on 05/29/2023)   [DISCONTINUED] amLODipine (NORVASC) 10 MG tablet Take 1 tablet (10 mg total) by mouth daily. (Patient not taking: Reported on 05/29/2023)   [DISCONTINUED] Dulaglutide (TRULICITY) 3 MG/0.5ML SOPN Inject 3 mg as directed once a week. (Patient not taking: Reported on 05/29/2023)   No facility-administered encounter medications on file as of 05/29/2023.    Review of Systems  Review of Systems  Constitutional: Negative.   HENT: Negative.    Cardiovascular: Negative.   Gastrointestinal: Negative.   Allergic/Immunologic: Negative.   Neurological: Negative.   Psychiatric/Behavioral: Negative.       Objective:   BP (!) 159/97 (BP Location: Right Arm, Patient Position: Sitting, Cuff Size: Normal)   Pulse 90   Temp 97.9 F (36.6 C)   Resp 12   Ht 4\' 11"  (1.499 m)   Wt 191 lb (86.6 kg)   LMP 07/16/2012   SpO2 99%   BMI 38.58 kg/m   Wt Readings from Last 5 Encounters:  05/29/23 191 lb (86.6 kg)  05/20/23 184 lb (83.5 kg)  05/15/21 235 lb 9.6 oz (106.9 kg)  02/22/21 220 lb (99.8 kg)  01/11/21 225 lb (102.1 kg)     Physical Exam Vitals and nursing note reviewed.  Constitutional:      General: She is not in acute distress.    Appearance: She is well-developed.  Cardiovascular:     Rate and Rhythm: Normal rate and regular rhythm.  Pulmonary:     Effort: Pulmonary effort is normal.     Breath sounds: Normal breath sounds.  Neurological:     Mental Status: She is alert and oriented to person, place, and time.       Assessment & Plan:   Type 2 diabetes mellitus with unspecified complications (HCC) -     Ambulatory referral to Endocrinology -     CBC -     Comprehensive metabolic panel -     metFORMIN HCl ER; Take 1 tablet (500 mg total) by  mouth daily with breakfast.  Dispense: 60 tablet; Refill: 2  Resistant hypertension -     amLODIPine Besylate; Take 1 tablet (10 mg total) by mouth daily.  Dispense: 90 tablet; Refill: 0 -     Ambulatory referral to Cardiology -     CBC -     Comprehensive metabolic panel  Type 2 diabetes mellitus with hyperglycemia, without long-term current use of insulin (HCC) -     Trulicity; Inject 3 mg as directed once a week.  Dispense: 6 mL; Refill: 3  Lipid screening -     Lipid panel     Return in about 4 weeks (around 06/26/2023).   Ivonne Andrew, NP 05/29/2023

## 2023-05-30 ENCOUNTER — Other Ambulatory Visit: Payer: Self-pay

## 2023-05-30 LAB — COMPREHENSIVE METABOLIC PANEL
ALT: 18 [IU]/L (ref 0–32)
AST: 23 [IU]/L (ref 0–40)
Albumin: 3.8 g/dL — ABNORMAL LOW (ref 3.9–4.9)
Alkaline Phosphatase: 122 [IU]/L — ABNORMAL HIGH (ref 44–121)
BUN/Creatinine Ratio: 20 (ref 12–28)
BUN: 16 mg/dL (ref 8–27)
Bilirubin Total: <0.2 mg/dL (ref 0.0–1.2)
CO2: 24 mmol/L (ref 20–29)
Calcium: 9.3 mg/dL (ref 8.7–10.3)
Chloride: 97 mmol/L (ref 96–106)
Creatinine, Ser: 0.79 mg/dL (ref 0.57–1.00)
Globulin, Total: 3.2 g/dL (ref 1.5–4.5)
Glucose: 399 mg/dL — ABNORMAL HIGH (ref 70–99)
Potassium: 4.3 mmol/L (ref 3.5–5.2)
Sodium: 136 mmol/L (ref 134–144)
Total Protein: 7 g/dL (ref 6.0–8.5)
eGFR: 83 mL/min/{1.73_m2} (ref >59)

## 2023-05-30 LAB — CBC
Hematocrit: 44.4 % (ref 34.0–46.6)
Hemoglobin: 13.6 g/dL (ref 11.1–15.9)
MCH: 25.4 pg — ABNORMAL LOW (ref 26.6–33.0)
MCHC: 30.6 g/dL — ABNORMAL LOW (ref 31.5–35.7)
MCV: 83 fL (ref 79–97)
Platelets: 236 10*3/uL (ref 150–450)
RBC: 5.36 x10E6/uL — ABNORMAL HIGH (ref 3.77–5.28)
RDW: 11.6 % — ABNORMAL LOW (ref 11.7–15.4)
WBC: 8.1 10*3/uL (ref 3.4–10.8)

## 2023-05-30 LAB — LIPID PANEL
Chol/HDL Ratio: 3.5 {ratio} (ref 0.0–4.4)
Cholesterol, Total: 237 mg/dL — ABNORMAL HIGH (ref 100–199)
HDL: 68 mg/dL (ref >39)
LDL Chol Calc (NIH): 149 mg/dL — ABNORMAL HIGH (ref 0–99)
Triglycerides: 113 mg/dL (ref 0–149)
VLDL Cholesterol Cal: 20 mg/dL (ref 5–40)

## 2023-05-31 ENCOUNTER — Other Ambulatory Visit: Payer: Self-pay | Admitting: Nurse Practitioner

## 2023-05-31 ENCOUNTER — Telehealth: Payer: Self-pay

## 2023-05-31 DIAGNOSIS — N289 Disorder of kidney and ureter, unspecified: Secondary | ICD-10-CM

## 2023-05-31 LAB — MICROALBUMIN / CREATININE URINE RATIO
Creatinine, Urine: 37.9 mg/dL
Microalb/Creat Ratio: 2070 mg/g{creat} — ABNORMAL HIGH (ref 0–29)
Microalbumin, Urine: 784.6 ug/mL

## 2023-05-31 NOTE — Progress Notes (Unsigned)
   Care Guide Note  05/31/2023 Name: Kaylee Lynn MRN: [id number] DOB: 01-27-1961  Referred by: Ivonne Andrew, NP Reason for referral : Care Coordination (Outreach to schedule with Pharm D )   Kaylee Lynn is a 62 y.o. year old female who is a primary care patient of Ivonne Andrew, NP. Kaylee Lynn was referred to the pharmacist for assistance related to HTN and DM.    An unsuccessful telephone outreach was attempted today to contact the patient who was referred to the pharmacy team for assistance with medication management. Additional attempts will be made to contact the patient.   Penne Lash , RMA     [facility]  [facility], [facility] Guide  Direct Dial: [phone]  Website: [email or web address]

## 2023-06-03 NOTE — Progress Notes (Unsigned)
   Care Guide Note  06/03/2023 Name: Kaylee Lynn MRN: [id number] DOB: 04-12-1961  Referred by: Ivonne Andrew, NP Reason for referral : Care Coordination (Outreach to schedule with Pharm D )   Kaylee Lynn is a 62 y.o. year old female who is a primary care patient of Ivonne Andrew, NP. Kaylee Lynn was referred to the pharmacist for assistance related to HTN and DM.    A second unsuccessful telephone outreach was attempted today to contact the patient who was referred to the pharmacy team for assistance with medication management. Additional attempts will be made to contact the patient.  Penne Lash , RMA     [facility]  [facility], [facility] Guide  Direct Dial: [phone]  Website: [email or web address]

## 2023-06-05 NOTE — Progress Notes (Signed)
   Care Guide Note  06/05/2023 Name: Kaylee Lynn MRN: [id number] DOB: Apr 27, 1961  Referred by: Ivonne Andrew, NP Reason for referral : Care Coordination (Outreach to schedule with Pharm D )   Kaylee Lynn is a 62 y.o. year old female who is a primary care patient of Ivonne Andrew, NP. Kaylee Lynn was referred to the pharmacist for assistance related to HTN and HLD.    A third unsuccessful telephone outreach was attempted today to contact the patient who was referred to the pharmacy team for assistance with medication management. The Population Health team is pleased to engage with this patient at any time in the future upon receipt of referral and should he/she be interested in assistance from the [facility] team.   Penne Lash , RMA     [facility]  [facility], [facility] Guide  Direct Dial: [phone]  Website: [email or web address]

## 2023-06-14 ENCOUNTER — Ambulatory Visit: Payer: Self-pay

## 2023-06-27 ENCOUNTER — Ambulatory Visit: Payer: Self-pay | Admitting: Nurse Practitioner

## 2023-07-03 ENCOUNTER — Ambulatory Visit: Payer: Self-pay | Admitting: Nurse Practitioner

## 2023-07-12 ENCOUNTER — Telehealth: Payer: Self-pay

## 2023-07-12 NOTE — Telephone Encounter (Signed)
See note

## 2023-07-19 ENCOUNTER — Ambulatory Visit: Payer: Self-pay | Admitting: Nurse Practitioner

## 2023-07-25 ENCOUNTER — Encounter (HOSPITAL_BASED_OUTPATIENT_CLINIC_OR_DEPARTMENT_OTHER): Payer: Self-pay

## 2023-07-31 DIAGNOSIS — H20041 Secondary noninfectious iridocyclitis, right eye: Secondary | ICD-10-CM | POA: Diagnosis not present

## 2023-07-31 DIAGNOSIS — E113292 Type 2 diabetes mellitus with mild nonproliferative diabetic retinopathy without macular edema, left eye: Secondary | ICD-10-CM | POA: Diagnosis not present

## 2023-08-14 ENCOUNTER — Encounter: Payer: Self-pay | Admitting: Nurse Practitioner

## 2023-08-14 ENCOUNTER — Telehealth: Payer: Self-pay | Admitting: Nurse Practitioner

## 2023-08-14 DIAGNOSIS — E118 Type 2 diabetes mellitus with unspecified complications: Secondary | ICD-10-CM

## 2023-08-14 MED ORDER — LANCETS MISC. MISC: 1.0000 | Freq: Three times a day (TID) | 0 refills | Status: AC

## 2023-08-14 MED ORDER — BLOOD GLUCOSE TEST VI STRP: 1.0000 | ORAL_STRIP | Freq: Three times a day (TID) | 0 refills | Status: AC

## 2023-08-14 MED ORDER — BLOOD GLUCOSE MONITORING SUPPL DEVI: 1.0000 | Freq: Three times a day (TID) | 0 refills | Status: AC

## 2023-08-14 MED ORDER — LANCET DEVICE MISC: 1.0000 | Freq: Three times a day (TID) | 0 refills | Status: AC

## 2023-08-14 NOTE — Progress Notes (Signed)
Virtual Visit via Video Note  I connected with Kaylee Lynn on 08/14/23 at  8:20 AM EST by a video enabled telemedicine application and verified that I am speaking with the correct person using two identifiers.  Location: Patient: home Provider: office   I discussed the limitations of evaluation and management by telemedicine and the availability of in person appointments. The patient expressed understanding and agreed to proceed.  History of Present Illness:  Patient presents today for a video visit for a follow-up.  She was previously followed by endocrinology for diabetes and cardiology for resistant hypertension.  She does have an upcoming appointment scheduled with endocrinology and will be calling today to schedule appointments with cardiology and nephrology.  A1c at last visit was greater than 15.  Patient states that she does need a new glucometer and supplies.  We will send in a prescription today.  Denies f/c/s, n/v/d, hemoptysis, PND, leg swelling Denies chest pain or edema     Observations/Objective:     05/29/2023   11:06 AM 05/29/2023   10:33 AM 05/29/2023   10:20 AM  Vitals with BMI  Systolic 196 198 782  Diastolic 76 57 57      Assessment and Plan:  1. Type 2 diabetes mellitus with unspecified complications (HCC) (Primary)  - Blood Glucose Monitoring Suppl DEVI; 1 each by Does not apply route in the morning, at noon, and at bedtime. May substitute to any manufacturer covered by patient's insurance.  Dispense: 1 each; Refill: 0 - Glucose Blood (BLOOD GLUCOSE TEST STRIPS) STRP; 1 each by In Vitro route in the morning, at noon, and at bedtime. May substitute to any manufacturer covered by patient's insurance.  Dispense: 100 strip; Refill: 0 - Lancet Device MISC; 1 each by Does not apply route in the morning, at noon, and at bedtime. May substitute to any manufacturer covered by patient's insurance.  Dispense: 1 each; Refill: 0 - Lancets Misc. MISC; 1 each by Does  not apply route in the morning, at noon, and at bedtime. May substitute to any manufacturer covered by patient's insurance.  Dispense: 100 each; Refill: 0  Follow up:  Follow up in 1 month    I discussed the assessment and treatment plan with the patient. The patient was provided an opportunity to ask questions and all were answered. The patient agreed with the plan and demonstrated an understanding of the instructions.   The patient was advised to call back or seek an in-person evaluation if the symptoms worsen or if the condition fails to improve as anticipated.  I provided 23 minutes of non-face-to-face time during this encounter.   Ivonne Andrew, NP

## 2023-08-14 NOTE — Patient Instructions (Signed)
1. Type 2 diabetes mellitus with unspecified complications (HCC) (Primary)  - Blood Glucose Monitoring Suppl DEVI; 1 each by Does not apply route in the morning, at noon, and at bedtime. May substitute to any manufacturer covered by patient's insurance.  Dispense: 1 each; Refill: 0 - Glucose Blood (BLOOD GLUCOSE TEST STRIPS) STRP; 1 each by In Vitro route in the morning, at noon, and at bedtime. May substitute to any manufacturer covered by patient's insurance.  Dispense: 100 strip; Refill: 0 - Lancet Device MISC; 1 each by Does not apply route in the morning, at noon, and at bedtime. May substitute to any manufacturer covered by patient's insurance.  Dispense: 1 each; Refill: 0 - Lancets Misc. MISC; 1 each by Does not apply route in the morning, at noon, and at bedtime. May substitute to any manufacturer covered by patient's insurance.  Dispense: 100 each; Refill: 0

## 2023-08-25 ENCOUNTER — Telehealth: Payer: Self-pay | Admitting: Nurse Practitioner

## 2023-08-25 NOTE — Telephone Encounter (Signed)
 Patient was identified as falling into the [place] Measure - Diabetes.   Patient was: Left voicemail to schedule with primary care provider.  Mychart message sent to patient

## 2023-08-26 ENCOUNTER — Other Ambulatory Visit: Payer: Self-pay | Admitting: Nurse Practitioner

## 2023-08-26 DIAGNOSIS — I1A0 Resistant hypertension: Secondary | ICD-10-CM

## 2023-09-18 ENCOUNTER — Ambulatory Visit: Payer: BLUE CROSS/BLUE SHIELD | Admitting: Internal Medicine

## 2023-09-18 ENCOUNTER — Encounter: Payer: Self-pay | Admitting: Internal Medicine

## 2023-09-18 VITALS — BP 140/100 | HR 96 | Ht 59.0 in | Wt 179.0 lb

## 2023-09-18 DIAGNOSIS — Z7984 Long term (current) use of oral hypoglycemic drugs: Secondary | ICD-10-CM

## 2023-09-18 DIAGNOSIS — Z794 Long term (current) use of insulin: Secondary | ICD-10-CM

## 2023-09-18 DIAGNOSIS — R809 Proteinuria, unspecified: Secondary | ICD-10-CM

## 2023-09-18 DIAGNOSIS — E1142 Type 2 diabetes mellitus with diabetic polyneuropathy: Secondary | ICD-10-CM | POA: Insufficient documentation

## 2023-09-18 DIAGNOSIS — E1129 Type 2 diabetes mellitus with other diabetic kidney complication: Secondary | ICD-10-CM | POA: Insufficient documentation

## 2023-09-18 DIAGNOSIS — I10 Essential (primary) hypertension: Secondary | ICD-10-CM | POA: Insufficient documentation

## 2023-09-18 DIAGNOSIS — E1165 Type 2 diabetes mellitus with hyperglycemia: Secondary | ICD-10-CM | POA: Insufficient documentation

## 2023-09-18 DIAGNOSIS — Z7985 Long-term (current) use of injectable non-insulin antidiabetic drugs: Secondary | ICD-10-CM

## 2023-09-18 DIAGNOSIS — E118 Type 2 diabetes mellitus with unspecified complications: Secondary | ICD-10-CM

## 2023-09-18 LAB — POCT GLYCOSYLATED HEMOGLOBIN (HGB A1C): HbA1c POC (<> result, manual entry): >15 % (ref 4.0–5.6)

## 2023-09-18 LAB — POCT GLUCOSE (DEVICE FOR HOME USE): POC Glucose: 403 mg/dL — AB (ref 70–99)

## 2023-09-18 MED ORDER — OLMESARTAN MEDOXOMIL-HCTZ 40-25 MG PO TABS: 1.0000 | ORAL_TABLET | Freq: Every day | ORAL | 2 refills | Status: DC

## 2023-09-18 MED ORDER — LANTUS SOLOSTAR 100 UNIT/ML ~~LOC~~ SOPN: 12.0000 [IU] | PEN_INJECTOR | Freq: Every day | SUBCUTANEOUS | 2 refills | Status: DC

## 2023-09-18 MED ORDER — METFORMIN HCL ER 500 MG PO TB24: 500.0000 mg | ORAL_TABLET | Freq: Two times a day (BID) | ORAL | 2 refills | Status: DC

## 2023-09-18 MED ORDER — TIRZEPATIDE 5 MG/0.5ML ~~LOC~~ SOAJ: 5.0000 mg | SUBCUTANEOUS | 2 refills | Status: DC

## 2023-09-18 NOTE — Progress Notes (Signed)
 Name: Kaylee Lynn  MRN/ DOB: [id number], 06/27/1960   Age/ Sex: 63 y.o., female    PCP: Ivonne Andrew, NP   Reason for Endocrinology Evaluation: Type 2 Diabetes Mellitus     Date of Initial Endocrinology Visit: 09/18/2023     PATIENT IDENTIFIER: Kaylee Lynn is a 63 y.o. female with a past medical history of Dm, HTN. The patient presented for initial endocrinology clinic visit on 09/18/2023 for consultative assistance with her diabetes management.    HPI: Kaylee Lynn was    Diagnosed with DM 2008 Prior Medications tried/Intolerance: She was on insulin 2015-2021. Metformin 2014-2015 Currently checking blood sugars occasionally   Hemoglobin A1c has ranged from 6.4% in 2021, peaking at > 15.0% in 2020.  She was seen by Dr. Everardo All from 2014 to  2022.  She was without glycemic agents for a while prior to restarting in 05/2023  Trulicity has been cost prohibitive   Endorses Polydipsia and polyuria  Denies nausea or vomiting  Denies constipation or diarrhea   Eats 3 meals a day plus snacks, drinks sugar free   HOME DIABETES REGIMEN: Metformin 500 mg XR, 1 tablet daily Trulicity 3 mg weekly   Statin: no ACE-I/ARB: yes   METER DOWNLOAD SUMMARY: n/a   DIABETIC COMPLICATIONS: Microvascular complications:  Neuropathy Denies: CKD Last eye exam: Completed 07/2023  Macrovascular complications:   Denies: CAD, PVD, CVA   PAST HISTORY: Past Medical History:  Past Medical History:  Diagnosis Date   Allergy    Anemia    history   Bronchitis    Hx - uses inhaler prn   Hypertension    Type I (juvenile type) diabetes mellitus without mention of complication, not stated as uncontrolled    per patient type 2    Vertigo    Past Surgical History:  Past Surgical History:  Procedure Laterality Date   left eye surgery     at age 33yrs, weak muscle   missed abortion     elective     Social History:  reports that she has never smoked. She has never used  smokeless tobacco. She reports that she does not currently use alcohol after a past usage of about 1.0 standard drink of alcohol per week. She reports that she does not use drugs. Family History:  Family History  Problem Relation Age of Onset   Hypertension Mother    Hypertension Father    CAD Father        CABG   Hypertension Sister    Stroke Brother    Hypertension Brother    Colon cancer Neg Hx    Esophageal cancer Neg Hx    Rectal cancer Neg Hx    Stomach cancer Neg Hx      HOME MEDICATIONS: Allergies as of 09/18/2023   No Known Allergies      Medication List        Accurate as of September 18, 2023  7:03 AM. If you have any questions, ask your nurse or doctor.          albuterol 108 (90 Base) MCG/ACT inhaler Commonly known as: VENTOLIN HFA Inhale 1-2 puffs into the lungs every 6 (six) hours as needed for wheezing or shortness of breath.   amLODipine 10 MG tablet Commonly known as: NORVASC TAKE 1 TABLET BY MOUTH EVERY DAY   ascorbic acid 500 MG tablet Commonly known as: VITAMIN C Take 500 mg by mouth daily.   BD Pen Needle Nano 2nd Gen  32G X 4 MM Misc Generic drug: Insulin Pen Needle   Blood Glucose Monitoring Suppl Devi 1 each by Does not apply route in the morning, at noon, and at bedtime. May substitute to any manufacturer covered by patient's insurance.   Accu-Chek Guide Me w/Device Kit 3 (three) times daily.   CENTRUM SILVER 50+WOMEN PO Take 1 tablet by mouth daily.   chlorthalidone 25 MG tablet Commonly known as: HYGROTON Take 1 tablet (25 mg total) by mouth daily. NEED APPOINTMENT   ipratropium 0.06 % nasal spray Commonly known as: ATROVENT Place 2 sprays into both nostrils 4 (four) times daily.   losartan-hydrochlorothiazide 50-12.5 MG tablet Commonly known as: HYZAAR Take 1 tablet by mouth daily.   magnesium chloride 64 MG Tbec SR tablet Commonly known as: SLOW-MAG Take 1 tablet by mouth daily.   metFORMIN 500 MG 24 hr tablet Commonly  known as: GLUCOPHAGE-XR Take 1 tablet (500 mg total) by mouth daily with breakfast.   nebivolol 5 MG tablet Commonly known as: BYSTOLIC Take 1 tablet (5 mg total) by mouth daily. Please call to schedule an appointment with Dr. Duke Salvia for refills.   olmesartan 40 MG tablet Commonly known as: BENICAR Take 1 tablet (40 mg total) by mouth daily. NEED APPOINTMENT   OMEGA 3 PO Take 1 tablet by mouth.   ondansetron 4 MG tablet Commonly known as: ZOFRAN Take 1 tablet (4 mg total) by mouth every 6 (six) hours.   OneTouch Delica Lancets 33G Misc USE AS DIRECTED TWICE A DAY   OneTouch Verio test strip Generic drug: glucose blood 1 each by Other route 2 (two) times daily. as directed   prednisoLONE acetate 1 % ophthalmic suspension Commonly known as: PRED FORTE 1 drop 4 (four) times daily.   spironolactone 25 MG tablet Commonly known as: ALDACTONE Take 1 tablet (25 mg total) by mouth daily. NEED APPOINTMENT   triamcinolone cream 0.1 % Commonly known as: KENALOG APPLY 1 APPLICATION 3 (THREE) TIMES DAILY AS NEEDED FOR ITCHING OR RASH   trimethoprim-polymyxin b ophthalmic solution Commonly known as: POLYTRIM Place 1 drop into the right eye every 6 (six) hours.   Trulicity 3 MG/0.5ML Soaj Generic drug: Dulaglutide Inject 3 mg as directed once a week.         ALLERGIES: No Known Allergies   REVIEW OF SYSTEMS: A comprehensive ROS was conducted with the patient and is negative except as per HPI    OBJECTIVE:   VITAL SIGNS: LMP 07/16/2012    PHYSICAL EXAM:  General: Pt appears well and is in NAD  Neck: General: Supple without adenopathy or carotid bruits. Thyroid: Thyroid size normal.  No goiter or nodules appreciated.   Lungs: Clear with good BS bilat   Heart: RRR   Abdomen:  soft, nontender  Extremities:  Lower extremities - No pretibial edema.   Neuro: MS is good with appropriate affect, pt is alert and Ox3    DM foot exam:    DATA REVIEWED:  Lab Results   Component Value Date   HGBA1C 15.0 (A) 05/29/2023   HGBA1C 6.4 (A) 09/12/2020   HGBA1C 6.4 (A) 04/29/2020     Latest Reference Range & Units 05/29/23 10:39 05/29/23 10:41  COMPREHENSIVE METABOLIC PANEL  Rpt !   Sodium 134 - 144 mmol/L 136   Potassium 3.5 - 5.2 mmol/L 4.3   Chloride 96 - 106 mmol/L 97   CO2 20 - 29 mmol/L 24   Glucose 70 - 99 mg/dL 147 (H)   BUN  8 - 27 mg/dL 16   Creatinine 8.29 - 1.00 mg/dL 5.62   Calcium 8.7 - 13.0 mg/dL 9.3   BUN/Creatinine Ratio 12 - 28  20   eGFR >59 mL/min/1.73 83   Alkaline Phosphatase 44 - 121 IU/L 122 (H)   Albumin 3.9 - 4.9 g/dL 3.8 (L)   AST 0 - 40 IU/L 23   ALT 0 - 32 IU/L 18   Total Protein 6.0 - 8.5 g/dL 7.0   Total Bilirubin 0.0 - 1.2 mg/dL <8.6   Total CHOL/HDL Ratio 0.0 - 4.4 ratio 3.5   Cholesterol, Total 100 - 199 mg/dL 578 (H)   HDL Cholesterol >39 mg/dL 68   MICROALB/CREAT RATIO 0 - 29 mg/g creat  2,070 (H)  Triglycerides 0 - 149 mg/dL 469   VLDL Cholesterol Cal 5 - 40 mg/dL 20   LDL Chol Calc (NIH) 0 - 99 mg/dL 629 (H)    In office BG 403 Mg/DL  ASSESSMENT / PLAN / RECOMMENDATIONS:   1) Type 2 Diabetes Mellitus, poorly controlled, With neuropathic and  Microalbuminuria  complications - Most recent A1c of > 15.0 %. Goal A1c < 7.0 %.    -Poorly controlled diabetes -Advised the patient to avoid sugar sweetened beverages, we discussed avoiding snacks if possible, we also discussed low-carb options for snacks if necessary -With an A1c over 15%, I have recommended insulin, patient in agreement -Cautioned against hypoglycemia and to contact us with hypoglycemic episodes -She was on Trulicity but this became cost prohibitive with $100 co-pay.  I have recommended Mounjaro and she was provided with a coupon to use -Will increase metformin as below  MEDICATIONS: Increase metformin 500 mg XR, 1 tablet twice daily Start Mounjaro 5 mg weekly Start Lantus 12 units daily  EDUCATION / INSTRUCTIONS: BG monitoring instructions:  Patient is instructed to check her blood sugars 1 times a day. Call [facility] if: BG persistently < 70  I reviewed the Rule of 15 for the treatment of hypoglycemia in detail with the patient. Literature supplied.   2) Diabetic complications:  Eye: Does not have known diabetic retinopathy.  Neuro/ Feet: Does  have known diabetic peripheral neuropathy. Renal: Patient does not have known baseline CKD. She is  on an ACEI/ARB at present.  3) Hypertension:   -BP out of control, she is on olmesartan and amlodipine only -I will change olmesartan as below  Medication  Stop olmesartan Start olmesartan-HCTZ 40-25 mg daily  4) Microalbuminuria:  -This was discussed with the patient today, and the importance of optimizing glucose control -We will continue to monitor, if this does not improve will refer to nephrology   Follow-up in 2 months  Signed electronically by: Lyndle Herrlich, MD  [facility] Endocrinology  [facility] [address] [place], [place] [address] Phone: [phone] FAX: [phone]   CC: Ivonne Andrew, NP [address] [place] [address] Phone: [phone]  Fax: [phone]    Return to Endocrinology clinic as below: Future Appointments  Date Time Provider Department Center  09/18/2023  9:30 AM Kaylee Lynn, Kaylee Dolores, MD [id number] None

## 2023-09-18 NOTE — Patient Instructions (Signed)
 Increase metformin 500 mg, 1 tablet twice daily Start Lantus (insulin) 12 units once daily Start Mounjaro 5 mg once weekly    HOW TO TREAT LOW BLOOD SUGARS (Blood sugar LESS THAN 70 MG/DL) Please follow the RULE OF 15 for the treatment of hypoglycemia treatment (when your (blood sugars are less than 70 mg/dL)   STEP 1: Take 15 grams of carbohydrates when your blood sugar is low, which includes:  3-4 GLUCOSE TABS  OR 3-4 OZ OF JUICE OR REGULAR SODA OR ONE TUBE OF GLUCOSE GEL    STEP 2: RECHECK blood sugar in 15 MINUTES STEP 3: If your blood sugar is still low at the 15 minute recheck --> then, go back to STEP 1 and treat AGAIN with another 15 grams of carbohydrates.

## 2023-10-11 ENCOUNTER — Encounter

## 2023-10-11 ENCOUNTER — Telehealth: Admitting: Physician Assistant

## 2023-10-11 ENCOUNTER — Other Ambulatory Visit: Payer: Self-pay | Admitting: Nurse Practitioner

## 2023-10-11 ENCOUNTER — Ambulatory Visit: Payer: Self-pay

## 2023-10-11 DIAGNOSIS — I1A0 Resistant hypertension: Secondary | ICD-10-CM

## 2023-10-11 DIAGNOSIS — L309 Dermatitis, unspecified: Secondary | ICD-10-CM

## 2023-10-11 DIAGNOSIS — J302 Other seasonal allergic rhinitis: Secondary | ICD-10-CM | POA: Diagnosis not present

## 2023-10-11 MED ORDER — CETIRIZINE HCL 10 MG PO TABS: 10.0000 mg | ORAL_TABLET | Freq: Every day | ORAL | 0 refills | Status: AC

## 2023-10-11 MED ORDER — OLOPATADINE HCL 0.1 % OP SOLN: 1.0000 [drp] | Freq: Two times a day (BID) | OPHTHALMIC | 0 refills | Status: AC

## 2023-10-11 MED ORDER — FLUTICASONE PROPIONATE 50 MCG/ACT NA SUSP: 2.0000 | Freq: Every day | NASAL | 0 refills | Status: AC

## 2023-10-11 NOTE — Telephone Encounter (Signed)
 This RN made the first attempt to contact the patient. Patient did not answer, RN LVM with a callback number.    Copied From CRM [phone]. Reason for Triage: Patient would like medication called in for allergies and her eczema flare up. For the allergy medication, she is requesting something that works well with her diabetes.  Symptoms: sneezing, runny eyes, head congestion.

## 2023-10-11 NOTE — Progress Notes (Signed)
 E visit for Allergic Rhinitis We are sorry that you are not feeling well.  Here is how we plan to help!  Based on what you have shared with me it looks like you have Allergic Rhinitis.  Rhinitis is when a reaction occurs that causes nasal congestion, runny nose, sneezing, and itching.  Most types of rhinitis are caused by an inflammation and are associated with symptoms in the eyes ears or throat. There are several types of rhinitis.  The most common are acute rhinitis, which is usually caused by a viral illness, allergic or seasonal rhinitis, and nonallergic or year-round rhinitis.  Nasal allergies occur certain times of the year.  Allergic rhinitis is caused when allergens in the air trigger the release of histamine in the body.  Histamine causes itching, swelling, and fluid to build up in the fragile linings of the nasal passages, sinuses and eyelids.  An itchy nose and clear discharge are common.  I recommend the following over the counter treatments: You should take a daily dose of antihistamine, I have prescribed Cetirizine  10mg  Take 1 tablet daily for up to 30 days.  I also would recommend a nasal spray: I have prescribed Flonase  2 sprays into each nostril once daily  You may also benefit from eye drops such as: I have prescribed Olopatadine  0.1% Use 1 drop in each eye twice daily as needed for itchy, watery eyes.  HOME CARE:  You can use an over-the-counter saline nasal spray as needed Avoid areas where there is heavy dust, mites, or molds Stay indoors on windy days during the pollen season Keep windows closed in home, at least in bedroom; use air conditioner. Use high-efficiency house air filter Keep windows closed in car, turn AC on re-circulate Avoid playing out with dog during pollen season  GET HELP RIGHT AWAY IF:  If your symptoms do not improve within 10 days You become short of breath You develop yellow or green discharge from your nose for over 3 days You have coughing  fits  MAKE SURE YOU:  Understand these instructions Will watch your condition Will get help right away if you are not doing well or get worse  Thank you for choosing an e-visit. Your e-visit answers were reviewed by a board certified advanced clinical practitioner to complete your personal care plan. Depending upon the condition, your plan could have included both over the counter or prescription medications. Please review your pharmacy choice. Be sure that the pharmacy you have chosen is open so that you can pick up your prescription now.  If there is a problem you may message your provider in MyChart to have the prescription routed to another pharmacy. Your safety is important to us . If you have drug allergies check your prescription carefully.  For the next 24 hours, you can use MyChart to ask questions about today's visit, request a non-urgent call back, or ask for a work or school excuse from your e-visit provider. You will get an email in the next two days asking about your experience. I hope that your e-visit has been valuable and will speed your recovery.       I have spent 5 minutes in review of e-visit questionnaire, review and updating patient chart, medical decision making and response to patient.   Delon CHRISTELLA Dickinson, PA-C

## 2023-10-11 NOTE — Telephone Encounter (Signed)
  Chief Complaint: allergies Symptoms: runny nose, sneezing, watery eyes, H/A Frequency: 2 days  Pertinent Negatives: Patient denies fever, SOB, CP Disposition: [] ED /[] Urgent Care (no appt availability in office) / [] Appointment(In office/virtual)/ []  [facility]/ [] Home Care/ [] Refused Recommended Disposition /[] [facility]/ [x]  Follow-up with PCP Additional Notes: Pt calling c/o allergy sx. Upon further investigation, pt noted to have PCP visit today. Triager notified pt that Rx was sent for sx. Additionally, pt also wanting refill for eczema cream triamcinolone  cream (KENALOG ) 0.1 % as well as insulin  needles for her insulin  pen. Pt reports has enough needles to get her through the weekend. Triager instructed pt to call Shamle[place], Julian Obey, MD office on Monday to get appropriate needle refills.Patient verbalized understanding.    Reason for Disposition  Health Information question, no triage required and triager able to answer question  Answer Assessment - Initial Assessment Questions 1. SYMPTOM: "What's the main symptom you're concerned about?" (e.g., runny nose, stuffiness, sneezing, itching)     Sneezing, runny nose, watery eyes, H/A x 2 days 2. SEVERITY: "How bad is it?" "What does it keep you from doing?" (e.g., sleeping, working)      Worse when goes outside 3. EYES: "Are the eyes also red, watery, and itchy?"      watery 4. TRIGGER: "What pollen or other allergic substance do you think is causing the symptoms?"      Pollens  5. TREATMENT: "What medicine are you using?" "What medicine worked best in the past?"     unknown 6. OTHER SYMPTOMS: "Do you have any other symptoms?" (e.g., coughing, difficulty breathing, wheezing)     denies  Answer Assessment - Initial Assessment Questions 1. REASON FOR CALL or QUESTION: "What is your reason for calling today?" or "How can I best help you?" or "What question do you have that I can help answer?"     Pt  initially calling for allergy medications. Upon further investigation, pt noted to have virtual visit today with PCP. Pt notified that Rx was sent for sx.  Protocols used: Nasal Allergies (Hay Fever)-A-AH, Information Only Call - No Triage-A-AH

## 2023-10-11 NOTE — Telephone Encounter (Signed)
 Attempt made to reach pt: no answer: left voicemail and callback #[phone] to [facility] Patient [facility]

## 2023-10-14 ENCOUNTER — Other Ambulatory Visit: Payer: Self-pay

## 2023-10-14 MED ORDER — BD PEN NEEDLE NANO 2ND GEN 32G X 4 MM MISC: 1.0000 | 2 refills | Status: DC | PRN

## 2023-10-23 MED ORDER — TRIAMCINOLONE ACETONIDE 0.1 % EX CREA: TOPICAL_CREAM | CUTANEOUS | 1 refills | Status: DC

## 2023-10-30 ENCOUNTER — Other Ambulatory Visit: Payer: Self-pay | Admitting: Nurse Practitioner

## 2023-10-30 DIAGNOSIS — E118 Type 2 diabetes mellitus with unspecified complications: Secondary | ICD-10-CM

## 2023-11-01 DIAGNOSIS — H25813 Combined forms of age-related cataract, bilateral: Secondary | ICD-10-CM | POA: Diagnosis not present

## 2023-11-01 DIAGNOSIS — E113292 Type 2 diabetes mellitus with mild nonproliferative diabetic retinopathy without macular edema, left eye: Secondary | ICD-10-CM | POA: Diagnosis not present

## 2023-11-01 DIAGNOSIS — H20041 Secondary noninfectious iridocyclitis, right eye: Secondary | ICD-10-CM | POA: Diagnosis not present

## 2023-11-01 DIAGNOSIS — H4321 Crystalline deposits in vitreous body, right eye: Secondary | ICD-10-CM | POA: Diagnosis not present

## 2023-11-01 LAB — HM DIABETES EYE EXAM

## 2023-12-12 ENCOUNTER — Encounter: Payer: Self-pay | Admitting: Internal Medicine

## 2023-12-12 ENCOUNTER — Ambulatory Visit: Admitting: Internal Medicine

## 2023-12-12 VITALS — BP 138/82 | HR 84 | Ht 59.0 in | Wt 193.0 lb

## 2023-12-12 DIAGNOSIS — E118 Type 2 diabetes mellitus with unspecified complications: Secondary | ICD-10-CM

## 2023-12-12 DIAGNOSIS — E1129 Type 2 diabetes mellitus with other diabetic kidney complication: Secondary | ICD-10-CM | POA: Diagnosis not present

## 2023-12-12 DIAGNOSIS — E1165 Type 2 diabetes mellitus with hyperglycemia: Secondary | ICD-10-CM | POA: Diagnosis not present

## 2023-12-12 DIAGNOSIS — Z7985 Long-term (current) use of injectable non-insulin antidiabetic drugs: Secondary | ICD-10-CM

## 2023-12-12 DIAGNOSIS — I10 Essential (primary) hypertension: Secondary | ICD-10-CM | POA: Diagnosis not present

## 2023-12-12 DIAGNOSIS — G63 Polyneuropathy in diseases classified elsewhere: Secondary | ICD-10-CM | POA: Insufficient documentation

## 2023-12-12 DIAGNOSIS — E1142 Type 2 diabetes mellitus with diabetic polyneuropathy: Secondary | ICD-10-CM | POA: Diagnosis not present

## 2023-12-12 DIAGNOSIS — R809 Proteinuria, unspecified: Secondary | ICD-10-CM

## 2023-12-12 LAB — POCT GLYCOSYLATED HEMOGLOBIN (HGB A1C): Hemoglobin A1C: 10.8 % — AB (ref 4.0–5.6)

## 2023-12-12 LAB — POCT GLUCOSE (DEVICE FOR HOME USE): POC Glucose: 216 mg/dL — AB (ref 70–99)

## 2023-12-12 MED ORDER — LANTUS SOLOSTAR 100 UNIT/ML ~~LOC~~ SOPN: 15.0000 [IU] | PEN_INJECTOR | Freq: Every day | SUBCUTANEOUS | 2 refills | Status: DC

## 2023-12-12 MED ORDER — TIRZEPATIDE 7.5 MG/0.5ML ~~LOC~~ SOAJ: 7.5000 mg | SUBCUTANEOUS | 3 refills | Status: DC

## 2023-12-12 MED ORDER — METFORMIN HCL ER 500 MG PO TB24: 1000.0000 mg | ORAL_TABLET | Freq: Every day | ORAL | 3 refills | Status: DC

## 2023-12-12 MED ORDER — GABAPENTIN 100 MG PO CAPS: 100.0000 mg | ORAL_CAPSULE | Freq: Every day | ORAL | 3 refills | Status: DC

## 2023-12-12 NOTE — Progress Notes (Signed)
 Name: Kaylee Lynn  MRN/ DOB: [id number], 1960-09-07   Age/ Sex: 63 y.o., female    PCP: Jerrlyn Morel, NP   Reason for Endocrinology Evaluation: Type 2 Diabetes Mellitus     Date of Initial Endocrinology Visit: 09/18/2023    Lynn IDENTIFIER: Ms. Kaylee Lynn is a 63 y.o. female with a past medical history of Dm, HTN. Kaylee Lynn presented for initial endocrinology clinic visit on 09/18/2023 for consultative assistance with her diabetes management.    HPI: Kaylee Lynn was    Diagnosed with DM 2008 Prior Medications tried/Intolerance: She was on insulin  2015-2021. Metformin  2014-2015 Hemoglobin A1c has ranged from 6.4% in 2021, peaking at > 15.0% in 2020.  She was seen by Dr. Washington Hacker from 2014 to  2022.  She was without glycemic agents for a while prior to restarting in 05/2023  On her initial visit with me she had an A1c of  >15%, she was on metformin  and Trulicity .  I increased metformin , started her on Mounjaro  and basal insulin    HTN: Her blood pressure was out of control on her initial visit with me 08/2023.  I switched olmesartan  to olmesartan /HCTZ   SUBJECTIVE:   During Kaylee last visit (09/18/2023): A1c >15%  Today (12/12/23): Kaylee Lynn is here for follow-up on diabetes management.  She checks blood sugars occasionally . Kaylee Lynn has not had hypoglycemic episodes since Kaylee last clinic visit.    Denies nausea or vomiting  Denies constipation or diarrhea  Has funny feeling in her feet , feels like a pad , no tingling or numbness     HOME DIABETES REGIMEN: Metformin  500 mg XR, 1 tablet twice daily Mounjaro  5 mg weekly Lantus  12 units daily Olmesartan -HCTZ 40-25 mg daily    Statin: no ACE-I/ARB: yes   METER DOWNLOAD SUMMARY: n/a   DIABETIC COMPLICATIONS: Microvascular complications:  Neuropathy Denies: CKD Last eye exam: Completed 07/2023  Macrovascular complications:   Denies: CAD, PVD, CVA   PAST HISTORY: Past Medical  History:  Past Medical History:  Diagnosis Date   Allergy    Anemia    history   Bronchitis    Hx - uses inhaler prn   Hypertension    Type I (juvenile type) diabetes mellitus without mention of complication, not stated as uncontrolled    per Lynn type 2    Vertigo    Past Surgical History:  Past Surgical History:  Procedure Laterality Date   left eye surgery     at age 37yrs, weak muscle   missed abortion     elective     Social History:  reports that she has never smoked. She has never used smokeless tobacco. She reports that she does not currently use alcohol after a past usage of about 1.0 standard drink of alcohol per week. She reports that she does not use drugs. Family History:  Family History  Problem Relation Age of Onset   Hypertension Mother    Hypertension Father    CAD Father        CABG   Hypertension Sister    Stroke Brother    Hypertension Brother    Colon cancer Neg Hx    Esophageal cancer Neg Hx    Rectal cancer Neg Hx    Stomach cancer Neg Hx      HOME MEDICATIONS: Allergies as of 12/12/2023       Reactions   Other Itching        Medication List  Accurate as of December 12, 2023  9:13 AM. If you have any questions, ask your nurse or doctor.          amLODipine  10 MG tablet Commonly known as: NORVASC  TAKE 1 TABLET BY MOUTH EVERY DAY   ascorbic acid 500 MG tablet Commonly known as: VITAMIN C Take 500 mg by mouth daily.   BD Pen Needle Nano 2nd Gen 32G X 4 MM Misc Generic drug: Insulin  Pen Needle Inject 1 each as directed as needed.   Blood Glucose Monitoring Suppl Devi 1 each by Does not apply route in Kaylee morning, at noon, and at bedtime. May substitute to any manufacturer covered by Lynn's insurance.   Accu-Chek Guide Me w/Device Kit 3 (three) times daily.   CENTRUM SILVER 50+WOMEN PO Take 1 tablet by mouth daily.   cetirizine  10 MG tablet Commonly known as: ZYRTEC  Take 1 tablet (10 mg total) by mouth daily.    fluticasone  50 MCG/ACT nasal spray Commonly known as: FLONASE  Place 2 sprays into both nostrils daily.   ipratropium 0.06 % nasal spray Commonly known as: ATROVENT  Place 2 sprays into both nostrils 4 (four) times daily.   Lantus  SoloStar 100 UNIT/ML Solostar Pen Generic drug: insulin  glargine Inject 12 Units into Kaylee skin daily.   magnesium chloride 64 MG Tbec SR tablet Commonly known as: SLOW-MAG Take 1 tablet by mouth daily.   metFORMIN  500 MG 24 hr tablet Commonly known as: GLUCOPHAGE -XR TAKE 1 TABLET BY MOUTH EVERY DAY WITH BREAKFAST What changed: See Kaylee new instructions.   olmesartan -hydrochlorothiazide  40-25 MG tablet Commonly known as: BENICAR  HCT Take 1 tablet by mouth daily.   olopatadine  0.1 % ophthalmic solution Commonly known as: PATANOL Place 1 drop into both eyes 2 (two) times daily.   OMEGA 3 PO Take 1 tablet by mouth.   OneTouch Delica Lancets 33G Misc USE AS DIRECTED TWICE A DAY   OneTouch Verio test strip Generic drug: glucose blood 1 each by Other route 2 (two) times daily. as directed   prednisoLONE acetate 1 % ophthalmic suspension Commonly known as: PRED FORTE 1 drop 4 (four) times daily.   tirzepatide  5 MG/0.5ML Pen Commonly known as: MOUNJARO  Inject 5 mg into Kaylee skin once a week.   triamcinolone  cream 0.1 % Commonly known as: KENALOG  APPLY 1 APPLICATION 3 (THREE) TIMES DAILY AS NEEDED FOR ITCHING OR RASH   trimethoprim -polymyxin b  ophthalmic solution Commonly known as: POLYTRIM  Place 1 drop into Kaylee right eye every 6 (six) hours.         ALLERGIES: Allergies  Allergen Reactions   Other Itching     REVIEW OF SYSTEMS: A comprehensive ROS was conducted with Kaylee Lynn and is negative except as per HPI    OBJECTIVE:   VITAL SIGNS: BP 138/82 (BP Location: Left Arm, Lynn Position: Sitting, Cuff Size: Normal)   Pulse 84   Ht 4' 11 (1.499 m)   Wt 193 lb (87.5 kg)   LMP 07/16/2012   SpO2 95%   BMI 38.98 kg/m     PHYSICAL EXAM:  General: Pt appears well and is in NAD  Neck: General: Supple without adenopathy or carotid bruits. Thyroid : Thyroid  size normal.  No goiter or nodules appreciated.   Lungs: Clear with good BS bilat   Heart: RRR   Extremities:  Lower extremities - No pretibial edema.   Neuro: MS is good with appropriate affect, pt is alert and Ox3    DM foot exam: 12/12/2023  Kaylee skin of Kaylee  feet is intact without sores or ulcerations. Kaylee pedal pulses are 2+ on right and 2+ on left. Kaylee sensation is intact to a screening 5.07, 10 gram monofilament bilaterally   DATA REVIEWED:  Lab Results  Component Value Date   HGBA1C >15.0 09/18/2023   HGBA1C 15.0 (A) 05/29/2023   HGBA1C 6.4 (A) 09/12/2020     Latest Reference Range & Units 05/29/23 10:39 05/29/23 10:41  COMPREHENSIVE METABOLIC PANEL  Rpt !   Sodium 134 - 144 mmol/L 136   Potassium 3.5 - 5.2 mmol/L 4.3   Chloride 96 - 106 mmol/L 97   CO2 20 - 29 mmol/L 24   Glucose 70 - 99 mg/dL 027 (H)   BUN 8 - 27 mg/dL 16   Creatinine 2.53 - 1.00 mg/dL 6.64   Calcium 8.7 - 40.3 mg/dL 9.3   BUN/Creatinine Ratio 12 - 28  20   eGFR >59 mL/min/1.73 83   Alkaline Phosphatase 44 - 121 IU/L 122 (H)   Albumin 3.9 - 4.9 g/dL 3.8 (L)   AST 0 - 40 IU/L 23   ALT 0 - 32 IU/L 18   Total Protein 6.0 - 8.5 g/dL 7.0   Total Bilirubin 0.0 - 1.2 mg/dL <4.7   Total CHOL/HDL Ratio 0.0 - 4.4 ratio 3.5   Cholesterol, Total 100 - 199 mg/dL 425 (H)   HDL Cholesterol >39 mg/dL 68   MICROALB/CREAT RATIO 0 - 29 mg/g creat  2,070 (H)  Triglycerides 0 - 149 mg/dL 956   VLDL Cholesterol Cal 5 - 40 mg/dL 20   LDL Chol Calc (NIH) 0 - 99 mg/dL 387 (H)    In office BG 216 Mg/DL  ASSESSMENT / PLAN / RECOMMENDATIONS:   1) Type 2 Diabetes Mellitus, poorly controlled, With neuropathic and  Microalbuminuria  complications - Most recent A1c of 10.8 %. Goal A1c < 7.0 %.    - A1c has trended down from >15%  to 10.8%  - She is tolerating Mounjaro , will  increase - Fasting BG's > 200 mg/DL, will increase insulin  as well  MEDICATIONS: Continue  Metformin  500 mg XR, 1 tablet twice daily Increase Mounjaro  7.5 mg weekly Increase Lantus  15 units daily  EDUCATION / INSTRUCTIONS: BG monitoring instructions: Lynn is instructed to check her blood sugars 1 times a day. Call [facility] if: BG persistently < 70  I reviewed Kaylee Rule of 15 for Kaylee treatment of hypoglycemia in detail with Kaylee Lynn. Literature supplied.   2) Diabetic complications:  Eye: Does not have known diabetic retinopathy.  Neuro/ Feet: Does  have known diabetic peripheral neuropathy. Renal: Lynn does not have known baseline CKD. She is  on an ACEI/ARB at present.  3) Hypertension:   - BP controlled  Medication  Continue olmesartan -HCTZ 40-25 mg daily  4) Microalbuminuria:  -Emphasized Kaylee importance of optimizing glucose control -She is already on ARB -We will continue to monitor, if this does not improve will refer to nephrology   5) Peripheral Neuropathy:  -Lynn endorses discomfort with neuropathic symptoms - Will start gabapentin at bedtime, cautioned against lower extremity edema, as well as drowsiness and dizziness, I have encouraged Kaylee Lynn to take it at bedtime  Medication Start gabapentin 100 mg at bedtime  Follow-up in 3 months  Signed electronically by: Natale Bail, MD  [facility] End[facility]in[facility]  [facility] [address] [place], [place] [address] Phone: [phone] FAX: [phone]   CC: Jerrlyn Morel, NP [address]  [address] [place] [address] Phone: [phone]  Fax: [phone]    Return to Endocrinology clinic as below: No future appointments.

## 2023-12-12 NOTE — Patient Instructions (Addendum)
 Continue Metformin  500 mg, 1 tablet twice daily Increase Lantus  15 units once daily Increase  Mounjaro  7.5 mg once weekly    HOW TO TREAT LOW BLOOD SUGARS (Blood sugar LESS THAN 70 MG/DL) Please follow the RULE OF 15 for the treatment of hypoglycemia treatment (when your (blood sugars are less than 70 mg/dL)   STEP 1: Take 15 grams of carbohydrates when your blood sugar is low, which includes:  3-4 GLUCOSE TABS  OR 3-4 OZ OF JUICE OR REGULAR SODA OR ONE TUBE OF GLUCOSE GEL    STEP 2: RECHECK blood sugar in 15 MINUTES STEP 3: If your blood sugar is still low at the 15 minute recheck --> then, go back to STEP 1 and treat AGAIN with another 15 grams of carbohydrates.

## 2023-12-15 ENCOUNTER — Telehealth: Payer: Self-pay | Admitting: Nurse Practitioner

## 2023-12-15 NOTE — Telephone Encounter (Signed)
 Patient was identified as falling into the [place] Measure - Diabetes.   Patient was: Referred to Diabetes Management. Pt seen by endocrinology 12/12/23

## 2024-01-07 ENCOUNTER — Telehealth: Payer: Self-pay | Admitting: Nurse Practitioner

## 2024-01-07 NOTE — Telephone Encounter (Signed)
 Patient was identified as falling into the [place] Measure - Diabetes.   Patient was: Referred to Diabetes Management.  Sent pt Mychart msg to schedule appt w/PCP

## 2024-03-08 ENCOUNTER — Other Ambulatory Visit: Payer: Self-pay | Admitting: Nurse Practitioner

## 2024-03-08 DIAGNOSIS — E118 Type 2 diabetes mellitus with unspecified complications: Secondary | ICD-10-CM

## 2024-03-09 ENCOUNTER — Other Ambulatory Visit: Payer: Self-pay | Admitting: Nurse Practitioner

## 2024-03-09 DIAGNOSIS — I1A0 Resistant hypertension: Secondary | ICD-10-CM

## 2024-03-09 NOTE — Telephone Encounter (Signed)
 Pt is in need of a follow up appt with Tonya.

## 2024-03-19 ENCOUNTER — Ambulatory Visit: Admitting: Internal Medicine

## 2024-03-19 ENCOUNTER — Encounter: Payer: Self-pay | Admitting: Internal Medicine

## 2024-03-19 VITALS — BP 122/80 | HR 80 | Ht 59.0 in | Wt 200.4 lb

## 2024-03-19 DIAGNOSIS — E1142 Type 2 diabetes mellitus with diabetic polyneuropathy: Secondary | ICD-10-CM | POA: Diagnosis not present

## 2024-03-19 DIAGNOSIS — L309 Dermatitis, unspecified: Secondary | ICD-10-CM | POA: Diagnosis not present

## 2024-03-19 DIAGNOSIS — E1165 Type 2 diabetes mellitus with hyperglycemia: Secondary | ICD-10-CM | POA: Diagnosis not present

## 2024-03-19 DIAGNOSIS — E1129 Type 2 diabetes mellitus with other diabetic kidney complication: Secondary | ICD-10-CM | POA: Diagnosis not present

## 2024-03-19 DIAGNOSIS — R809 Proteinuria, unspecified: Secondary | ICD-10-CM

## 2024-03-19 DIAGNOSIS — G63 Polyneuropathy in diseases classified elsewhere: Secondary | ICD-10-CM

## 2024-03-19 LAB — POCT GLUCOSE (DEVICE FOR HOME USE): POC Glucose: 123 mg/dL — AB (ref 70–99)

## 2024-03-19 LAB — POCT GLYCOSYLATED HEMOGLOBIN (HGB A1C): Hemoglobin A1C: 8 % — AB (ref 4.0–5.6)

## 2024-03-19 MED ORDER — TRIAMCINOLONE ACETONIDE 0.1 % EX CREA: TOPICAL_CREAM | CUTANEOUS | 1 refills | Status: AC

## 2024-03-19 MED ORDER — TIRZEPATIDE 10 MG/0.5ML ~~LOC~~ SOAJ: 10.0000 mg | SUBCUTANEOUS | 3 refills | Status: DC

## 2024-03-19 NOTE — Patient Instructions (Signed)
 Continue Metformin  500 mg, 1 tablet twice daily Continue Lantus  15 units once daily Increase  Mounjaro  10 mg once weekly    HOW TO TREAT LOW BLOOD SUGARS (Blood sugar LESS THAN 70 MG/DL) Please follow the RULE OF 15 for the treatment of hypoglycemia treatment (when your (blood sugars are less than 70 mg/dL)   STEP 1: Take 15 grams of carbohydrates when your blood sugar is low, which includes:  3-4 GLUCOSE TABS  OR 3-4 OZ OF JUICE OR REGULAR SODA OR ONE TUBE OF GLUCOSE GEL    STEP 2: RECHECK blood sugar in 15 MINUTES STEP 3: If your blood sugar is still low at the 15 minute recheck --> then, go back to STEP 1 and treat AGAIN with another 15 grams of carbohydrates.

## 2024-03-19 NOTE — Progress Notes (Signed)
 Name: Kaylee Lynn  MRN/ DOB: [id number], Jun 24, 1961   Age/ Sex: 63 y.o., female    PCP: Oley Bascom RAMAN, NP   Reason for Endocrinology Evaluation: Type 2 Diabetes Mellitus     Date of Initial Endocrinology Visit: 09/18/2023    PATIENT IDENTIFIER: Kaylee Lynn is a 63 y.o. female with a past medical history of Dm, HTN. The patient presented for initial endocrinology clinic visit on 09/18/2023 for consultative assistance with her diabetes management.    HPI: Kaylee Lynn was    Diagnosed with DM 2008 Prior Medications tried/Intolerance: She was on insulin  2015-2021. Metformin  2014-2015 Hemoglobin A1c has ranged from 6.4% in 2021, peaking at > 15.0% in 2020.  She was seen by Dr. Kassie from 2014 to  2022.  She was without glycemic agents for a while prior to restarting in 05/2023  On her initial visit with me she had an A1c of  >15%, she was on metformin  and Trulicity .  I increased metformin , started her on Mounjaro  and basal insulin    HTN: Her blood pressure was out of control on her initial visit with me 08/2023.  I switched olmesartan  to olmesartan /HCTZ  She didn;t take Gabapentin  due to skepticisim about side effects  SUBJECTIVE:   During the last visit (12/12/2023): A1c 10.8%  Today (03/19/24): Kaylee Lynn is here for follow-up on diabetes management.  She checks blood sugars occasionally . The patient has not had hypoglycemic episodes .   Patient has been noted weight gain No nausea or vomiting  No constipation or diarrhea  Has funny feeling in her feet, improving with feet massage and pulsating machines   Has occasional skin rash , pruritic , needs a refill   HOME DIABETES REGIMEN: Metformin  500 mg XR, 1 tablet twice daily Mounjaro  7.5 mg weekly Lantus  15 units daily Olmesartan -HCTZ 40-25 mg daily Gabapentin  100 mg bedtime- not taking    Statin: no ACE-I/ARB: yes   METER DOWNLOAD SUMMARY: n/a   DIABETIC COMPLICATIONS: Microvascular  complications:  Neuropathy Denies: CKD Last eye exam: Completed 11/01/2023  Macrovascular complications:   Denies: CAD, PVD, CVA   PAST HISTORY: Past Medical History:  Past Medical History:  Diagnosis Date   Allergy    Anemia    history   Bronchitis    Hx - uses inhaler prn   Hypertension    Type I (juvenile type) diabetes mellitus without mention of complication, not stated as uncontrolled    per patient type 2    Vertigo    Past Surgical History:  Past Surgical History:  Procedure Laterality Date   left eye surgery     at age 1yrs, weak muscle   missed abortion     elective     Social History:  reports that she has never smoked. She has never used smokeless tobacco. She reports that she does not currently use alcohol after a past usage of about 1.0 standard drink of alcohol per week. She reports that she does not use drugs. Family History:  Family History  Problem Relation Age of Onset   Hypertension Mother    Hypertension Father    CAD Father        CABG   Hypertension Sister    Stroke Brother    Hypertension Brother    Colon cancer Neg Hx    Esophageal cancer Neg Hx    Rectal cancer Neg Hx    Stomach cancer Neg Hx      HOME MEDICATIONS: Allergies as  of 03/19/2024       Reactions   Other Itching        Medication List        Accurate as of March 19, 2024  8:57 AM. If you have any questions, ask your nurse or doctor.          Accu-Chek Guide Test test strip Generic drug: glucose blood USE AS DIRECTED 3 TIMES A DAY (MORNING, NOON, AND BEDTIME)   amLODipine  10 MG tablet Commonly known as: NORVASC  TAKE 1 TABLET BY MOUTH EVERY DAY   ascorbic acid 500 MG tablet Commonly known as: VITAMIN C Take 500 mg by mouth daily.   BD Pen Needle Nano 2nd Gen 32G X 4 MM Misc Generic drug: Insulin  Pen Needle Inject 1 each as directed as needed.   Blood Glucose Monitoring Suppl Devi 1 each by Does not apply route in the morning, at noon, and at  bedtime. May substitute to any manufacturer covered by patient's insurance.   Accu-Chek Guide Me w/Device Kit 3 (three) times daily.   CENTRUM SILVER 50+WOMEN PO Take 1 tablet by mouth daily.   cetirizine  10 MG tablet Commonly known as: ZYRTEC  Take 1 tablet (10 mg total) by mouth daily.   fluticasone  50 MCG/ACT nasal spray Commonly known as: FLONASE  Place 2 sprays into both nostrils daily.   gabapentin  100 MG capsule Commonly known as: NEURONTIN  Take 1 capsule (100 mg total) by mouth at bedtime.   ipratropium 0.06 % nasal spray Commonly known as: ATROVENT  Place 2 sprays into both nostrils 4 (four) times daily.   Lantus  SoloStar 100 UNIT/ML Solostar Pen Generic drug: insulin  glargine Inject 15 Units into the skin daily.   magnesium chloride 64 MG Tbec SR tablet Commonly known as: SLOW-MAG Take 1 tablet by mouth daily.   metFORMIN  500 MG 24 hr tablet Commonly known as: GLUCOPHAGE -XR Take 2 tablets (1,000 mg total) by mouth daily with breakfast.   olmesartan -hydrochlorothiazide  40-25 MG tablet Commonly known as: BENICAR  HCT Take 1 tablet by mouth daily.   olopatadine  0.1 % ophthalmic solution Commonly known as: PATANOL Place 1 drop into both eyes 2 (two) times daily.   OMEGA 3 PO Take 1 tablet by mouth.   OneTouch Delica Lancets 33G Misc USE AS DIRECTED TWICE A DAY   prednisoLONE acetate 1 % ophthalmic suspension Commonly known as: PRED FORTE 1 drop 4 (four) times daily.   tirzepatide  7.5 MG/0.5ML Pen Commonly known as: MOUNJARO  Inject 7.5 mg into the skin once a week.   triamcinolone  cream 0.1 % Commonly known as: KENALOG  APPLY 1 APPLICATION 3 (THREE) TIMES DAILY AS NEEDED FOR ITCHING OR RASH   trimethoprim -polymyxin b  ophthalmic solution Commonly known as: POLYTRIM  Place 1 drop into the right eye every 6 (six) hours.         ALLERGIES: Allergies  Allergen Reactions   Other Itching     REVIEW OF SYSTEMS: A comprehensive ROS was conducted  with the patient and is negative except as per HPI    OBJECTIVE:   VITAL SIGNS: BP 122/80 (BP Location: Left Arm, Patient Position: Sitting, Cuff Size: Large)   Pulse 80   Ht 4' 11 (1.499 m)   Wt 200 lb 6.4 oz (90.9 kg)   LMP 07/16/2012   SpO2 99%   BMI 40.48 kg/m     Filed Weights   03/19/24 0855  Weight: 200 lb 6.4 oz (90.9 kg)    PHYSICAL EXAM:  General: Pt appears well and is in NAD  Lungs: Clear with good BS bilat   Heart: RRR   Extremities:  Lower extremities - No pretibial edema.   Neuro: MS is good with appropriate affect, pt is alert and Ox3    DM foot exam: 12/12/2023  The skin of the feet is intact without sores or ulcerations. The pedal pulses are 2+ on right and 2+ on left. The sensation is intact to a screening 5.07, 10 gram monofilament bilaterally   DATA REVIEWED:  Lab Results  Component Value Date   HGBA1C 10.8 (A) 12/12/2023   HGBA1C >15.0 09/18/2023   HGBA1C 15.0 (A) 05/29/2023     Latest Reference Range & Units 05/29/23 10:39 05/29/23 10:41  COMPREHENSIVE METABOLIC PANEL  Rpt !   Sodium 134 - 144 mmol/L 136   Potassium 3.5 - 5.2 mmol/L 4.3   Chloride 96 - 106 mmol/L 97   CO2 20 - 29 mmol/L 24   Glucose 70 - 99 mg/dL 600 (H)   BUN 8 - 27 mg/dL 16   Creatinine 9.42 - 1.00 mg/dL 9.20   Calcium 8.7 - 89.6 mg/dL 9.3   BUN/Creatinine Ratio 12 - 28  20   eGFR >59 mL/min/1.73 83   Alkaline Phosphatase 44 - 121 IU/L 122 (H)   Albumin 3.9 - 4.9 g/dL 3.8 (L)   AST 0 - 40 IU/L 23   ALT 0 - 32 IU/L 18   Total Protein 6.0 - 8.5 g/dL 7.0   Total Bilirubin 0.0 - 1.2 mg/dL <9.7   Total CHOL/HDL Ratio 0.0 - 4.4 ratio 3.5   Cholesterol, Total 100 - 199 mg/dL 762 (H)   HDL Cholesterol >39 mg/dL 68   MICROALB/CREAT RATIO 0 - 29 mg/g creat  2,070 (H)  Triglycerides 0 - 149 mg/dL 886   VLDL Cholesterol Cal 5 - 40 mg/dL 20   LDL Chol Calc (NIH) 0 - 99 mg/dL 850 (H)    In office AH876 Mg/DL  ASSESSMENT / PLAN / RECOMMENDATIONS:   1) Type 2 Diabetes  Mellitus, poorly controlled, With neuropathic and  Microalbuminuria  complications - Most recent A1c of  8.0 %. Goal A1c < 7.0 %.    - A1c has trended down from  10.8% to 8.0%, I have praised the pt on improved glycemic control  -I have encouraged the patient to continue with lifestyle changes, she has been noted weight gain despite being on Mounjaro  7.5 mg weekly, we discussed the importance of incorporating low calorie diet as well as exercise, a referral to our CDE has been placed - Will increase Mounjaro  as below  MEDICATIONS: Continue  Metformin  500 mg XR, 1 tablet twice daily Increase Mounjaro  10 mg weekly Continue Lantus  15 units daily  EDUCATION / INSTRUCTIONS: BG monitoring instructions: Patient is instructed to check her blood sugars 1 times a day. Call [facility] if: BG persistently < 70  I reviewed the Rule of 15 for the treatment of hypoglycemia in detail with the patient. Literature supplied.   2) Diabetic complications:  Eye: Does not have known diabetic retinopathy.  Neuro/ Feet: Does  have known diabetic peripheral neuropathy. Renal: Patient does not have known baseline CKD. She is  on an ACEI/ARB at present.  3) Hypertension:   - BP controlled  Medication  Continue olmesartan -HCTZ 40-25 mg daily  4) Microalbuminuria:  -Emphasized the importance of optimizing glucose control -She is already on ARB -We will continue to monitor, if this does not improve will refer to nephrology   5) Peripheral Neuropathy:  -  Patient endorses discomfort with neuropathic symptoms -She was skeptical about gabapentin  and did not start - She has been using OTC machines to help with massaging and light therapy, which has improved her symptoms  6) Eczema  -A refill for Kenalog  cream has been sent to the pharmacy  Follow-up in 4 months  Signed electronically by: Stefano Redgie Butts, MD  [facility]  [facility] [address]  [place] [place], [place] [address] Phone: [phone] FAX: [phone]   CC: Oley Bascom RAMAN, NP [address] [place] [address] Phone: [phone]  Fax: [phone]    Return to Endocrinology clinic as below: No future appointments.

## 2024-05-08 ENCOUNTER — Encounter: Attending: Internal Medicine | Admitting: Dietician

## 2024-05-08 ENCOUNTER — Encounter: Payer: Self-pay | Admitting: Dietician

## 2024-05-08 VITALS — Ht 59.0 in | Wt 208.0 lb

## 2024-05-08 DIAGNOSIS — E118 Type 2 diabetes mellitus with unspecified complications: Secondary | ICD-10-CM | POA: Insufficient documentation

## 2024-05-08 NOTE — Progress Notes (Signed)
 Diabetes Self-Management Education  Visit Type: First/Initial  Appt. Start Time: 1450 Appt. End Time: 1555  05/10/2024  Ms. Kaylee Lynn, identified by name and date of birth, is a 63 y.o. female with a diagnosis of Diabetes: Type 2.   ASSESSMENT Patient is here today alone.  She was last seen by myself in 2016. She would like to understand why she is gaining weight on the Mounjaro . She is checking her CBG about 3 times per week and states her fingers are sore.  Patient request for CGM sent to MD.  Referral:  Type 2 Diabetes  History includes:  Type 2 Diabetes (2000), HTN, neuropathy Medications include:  Lantus  15 units q AM, Metformin , Mounjaro  7.5 mg, magnesium, Omega 3, MVI, vitamin C, Vitamin B-12  59 208 lbs 05/08/2024 174 lbs 08/2023 230 lbs 2023 243 lbs 2020 280 lbs highest 50 150 lbs in 40's  Labs: A1C 8% 03/19/2024 decreased form 10.8% 12/12/2023, eGFR   Lipid Panel     Component Value Date/Time   CHOL 237 (H) 05/29/2023 1039   TRIG 113 05/29/2023 1039   HDL 68 05/29/2023 1039   CHOLHDL 3.5 05/29/2023 1039   CHOLHDL 3 10/23/2017 0908   VLDL 12.4 10/23/2017 0908   LDLCALC 149 (H) 05/29/2023 1039   LABVLDL 20 05/29/2023 1039   Patient lives with her sister.  She does her own shopping and cooking.   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 Patient works for [employer] and went to their program called [employer].  This lowered her blood glucose but her blood pressure went very high.  It was a low carb plan (25 grams carbs per day) with 2 cups bullion per day, plus 3 oz meat 3 times daily (packaged program) and high fat.  She stopped the program in 2023.  Since she has been struggling. Has a glider (but in the garage).  Height 4' 11 (1.499 m), weight 208 lb (94.3 kg), last menstrual period 07/16/2012. Body mass index is 42.01 kg/m.   Diabetes Self-Management Education - 05/08/24 1522       Visit Information   Visit Type First/Initial      Initial Visit    Diabetes Type Type 2    Date Diagnosed 2000    Are you currently following a meal plan? No    Are you taking your medications as prescribed? Yes      Health Coping   How would you rate your overall health? Fair      Psychosocial Assessment   Patient Belief/Attitude about Diabetes Motivated to manage diabetes    What is the hardest part about your diabetes right now, causing you the most concern, or is the most worrisome to you about your diabetes?   Being active    Self-care barriers None    Self-management support Doctor's office    Other persons present Patient    Patient Concerns Nutrition/Meal planning;Healthy Lifestyle;Glycemic Control;Weight Control    Special Needs None    Preferred Learning Style No preference indicated    Learning Readiness Ready    How often do you need to have someone help you when you read instructions, pamphlets, or other written materials from your doctor or pharmacy? 1 - Never    What is the last grade level you completed in school? some college      Pre-Education Assessment   Patient understands the diabetes disease and treatment process. Needs Review    Patient understands incorporating nutritional management into lifestyle. Needs Review  Patient undertands incorporating physical activity into lifestyle. Needs Review    Patient understands using medications safely. Needs Review    Patient understands monitoring blood glucose, interpreting and using results Needs Review    Patient understands prevention, detection, and treatment of acute complications. Needs Review    Patient understands prevention, detection, and treatment of chronic complications. Needs Review    Patient understands how to develop strategies to address psychosocial issues. Needs Review    Patient understands how to develop strategies to promote health/change behavior. Needs Review      Complications   Last HgB A1C per patient/outside source 8 %   03/19/24 decreased form 10.8%  12/12/2023   How often do you check your blood sugar? 3-4 times / week    Fasting Blood glucose range (mg/dL) 869-820   867   Postprandial Blood glucose range (mg/dL) 869-820   821   Number of hypoglycemic episodes per month 0   feels low at 130   Number of hyperglycemic episodes ( >200mg /dL): Occasional    Have you had a dilated eye exam in the past 12 months? Yes    Have you had a dental exam in the past 12 months? No    Are you checking your feet? Yes    How many days per week are you checking your feet? 7      Dietary Intake   Breakfast sausage, coffee with heavy whipping cream OR gree yogurt usually and coffee with heavy whipping cream OR boiled egg    Snack (morning) rare popcorn, rare wurther's sugar free candy    Lunch fried chicken wings, fries    Snack (afternoon) none    Dinner broiled chicken or hamburger, salad, instant potatoes    Snack (evening) popcorn OR beef jerky    Beverage(s) water, coffee with heavy whipping cream, occasional sprite zero, SF cran juice rare      Activity / Exercise   Activity / Exercise Type ADL's      Patient Education   Previous Diabetes Education Yes   2016   Disease Pathophysiology Definition of diabetes, type 1 and 2, and the diagnosis of diabetes;Explored patient's options for treatment of their diabetes    Healthy Eating Role of diet in the treatment of diabetes and the relationship between the three main macronutrients and blood glucose level;Plate Method;Meal options for control of blood glucose level and chronic complications.;Reviewed blood glucose goals for pre and post meals and how to evaluate the patients' food intake on their blood glucose level.    Being Active Role of exercise on diabetes management, blood pressure control and cardiac health.;Helped patient identify appropriate exercises in relation to his/her diabetes, diabetes complications and other health issue.    Medications Reviewed patients medication for diabetes, action,  purpose, timing of dose and side effects.    Monitoring Identified appropriate SMBG and/or A1C goals.;Daily foot exams;Yearly dilated eye exam    Acute complications Taught prevention, symptoms, and  treatment of hypoglycemia - the 15 rule.;Discussed and identified patients' prevention, symptoms, and treatment of hyperglycemia.    Chronic complications Relationship between chronic complications and blood glucose control    Diabetes Stress and Support Identified and addressed patients feelings and concerns about diabetes;Worked with patient to identify barriers to care and solutions;Role of stress on diabetes      Individualized Goals (developed by patient)   Nutrition General guidelines for healthy choices and portions discussed    Physical Activity Exercise 5-7 days per week;30 minutes per day  Medications take my medication as prescribed    Monitoring  Consistenly use CGM    Problem Solving Eating Pattern;Addressing barriers to behavior change    Reducing Risk examine blood glucose patterns;do foot checks daily;treat hypoglycemia with 15 grams of carbs if blood glucose less than 70mg /dL      Post-Education Assessment   Patient understands the diabetes disease and treatment process. Demonstrates understanding / competency    Patient understands incorporating nutritional management into lifestyle. Comprehends key points    Patient undertands incorporating physical activity into lifestyle. Comprehends key points    Patient understands using medications safely. Comphrehends key points    Patient understands monitoring blood glucose, interpreting and using results Comprehends key points    Patient understands prevention, detection, and treatment of acute complications. Comprehends key points    Patient understands prevention, detection, and treatment of chronic complications. Comprehends key points    Patient understands how to develop strategies to address psychosocial issues. Comprehends key  points    Patient understands how to develop strategies to promote health/change behavior. Needs Review      Outcomes   Expected Outcomes Demonstrated interest in learning. Expect positive outcomes    Program Status Not Completed          Individualized Plan for Diabetes Self-Management Training:   Learning Objective:  Patient will have a greater understanding of diabetes self-management. Patient education plan is to attend individual and/or group sessions per assessed needs and concerns.   Plan:   Patient Instructions  Change heavy whipping cream to half and half and be careful of the portion size. Baked rather than fried most often. 1/2 your plate vegetables. Aim to be active daily   Expected Outcomes:  Demonstrated interest in learning. Expect positive outcomes  Education material provided: ADA - How to Thrive: A Guide for Your Journey with Diabetes, Meal plan card, Snack sheet, and Diabetes Resources  If problems or questions, patient to contact team via:  Phone  Future DSME appointment:   2-3 months

## 2024-05-10 NOTE — Patient Instructions (Signed)
 Change heavy whipping cream to half and half and be careful of the portion size. Baked rather than fried most often. 1/2 your plate vegetables. Aim to be active daily

## 2024-05-11 ENCOUNTER — Other Ambulatory Visit: Payer: Self-pay | Admitting: Internal Medicine

## 2024-05-11 MED ORDER — DEXCOM G7 SENSOR MISC: 1.0000 | 3 refills | Status: DC

## 2024-05-11 NOTE — Progress Notes (Signed)
 Dexcom sent

## 2024-05-19 ENCOUNTER — Other Ambulatory Visit: Payer: Self-pay | Admitting: Nurse Practitioner

## 2024-05-19 DIAGNOSIS — E118 Type 2 diabetes mellitus with unspecified complications: Secondary | ICD-10-CM

## 2024-05-27 DIAGNOSIS — H4321 Crystalline deposits in vitreous body, right eye: Secondary | ICD-10-CM | POA: Diagnosis not present

## 2024-05-27 DIAGNOSIS — E113292 Type 2 diabetes mellitus with mild nonproliferative diabetic retinopathy without macular edema, left eye: Secondary | ICD-10-CM | POA: Diagnosis not present

## 2024-05-27 DIAGNOSIS — H348322 Tributary (branch) retinal vein occlusion, left eye, stable: Secondary | ICD-10-CM | POA: Diagnosis not present

## 2024-05-27 DIAGNOSIS — H25813 Combined forms of age-related cataract, bilateral: Secondary | ICD-10-CM | POA: Diagnosis not present

## 2024-05-29 ENCOUNTER — Other Ambulatory Visit: Payer: Self-pay | Admitting: Nurse Practitioner

## 2024-05-29 DIAGNOSIS — E118 Type 2 diabetes mellitus with unspecified complications: Secondary | ICD-10-CM

## 2024-06-19 ENCOUNTER — Other Ambulatory Visit: Payer: Self-pay

## 2024-06-19 MED ORDER — TIRZEPATIDE 10 MG/0.5ML ~~LOC~~ SOAJ: 10.0000 mg | SUBCUTANEOUS | 3 refills | Status: DC

## 2024-06-20 ENCOUNTER — Other Ambulatory Visit: Payer: Self-pay | Admitting: Nurse Practitioner

## 2024-06-20 DIAGNOSIS — E118 Type 2 diabetes mellitus with unspecified complications: Secondary | ICD-10-CM

## 2024-06-22 ENCOUNTER — Telehealth: Payer: Self-pay

## 2024-06-22 ENCOUNTER — Other Ambulatory Visit: Payer: Self-pay

## 2024-06-22 DIAGNOSIS — I1A0 Resistant hypertension: Secondary | ICD-10-CM

## 2024-06-22 MED ORDER — AMLODIPINE BESYLATE 10 MG PO TABS: 10.0000 mg | ORAL_TABLET | Freq: Every day | ORAL | 0 refills | Status: AC

## 2024-06-22 NOTE — Telephone Encounter (Signed)
 amLODipine  (NORVASC ) 10 MG tablet [[id number]]

## 2024-06-22 NOTE — Telephone Encounter (Signed)
 Done [facility]

## 2024-06-22 NOTE — Telephone Encounter (Signed)
 Pt will need a  med check office visit. Thanks [facility]

## 2024-06-27 ENCOUNTER — Other Ambulatory Visit: Payer: Self-pay | Admitting: Internal Medicine

## 2024-07-03 ENCOUNTER — Encounter: Admitting: Dietician

## 2024-07-24 ENCOUNTER — Ambulatory Visit: Admitting: Internal Medicine

## 2024-07-27 ENCOUNTER — Ambulatory Visit: Admitting: Internal Medicine

## 2024-07-30 ENCOUNTER — Encounter: Payer: Self-pay | Admitting: Internal Medicine

## 2024-07-30 ENCOUNTER — Telehealth: Admitting: Internal Medicine

## 2024-07-30 ENCOUNTER — Telehealth: Payer: Self-pay | Admitting: Internal Medicine

## 2024-07-30 VITALS — Ht 59.0 in | Wt 200.0 lb

## 2024-07-30 DIAGNOSIS — E1165 Type 2 diabetes mellitus with hyperglycemia: Secondary | ICD-10-CM

## 2024-07-30 DIAGNOSIS — I10 Essential (primary) hypertension: Secondary | ICD-10-CM

## 2024-07-30 DIAGNOSIS — E1142 Type 2 diabetes mellitus with diabetic polyneuropathy: Secondary | ICD-10-CM

## 2024-07-30 DIAGNOSIS — E1129 Type 2 diabetes mellitus with other diabetic kidney complication: Secondary | ICD-10-CM

## 2024-07-30 DIAGNOSIS — E118 Type 2 diabetes mellitus with unspecified complications: Secondary | ICD-10-CM

## 2024-07-30 MED ORDER — TIRZEPATIDE 12.5 MG/0.5ML ~~LOC~~ SOAJ: 12.5000 mg | SUBCUTANEOUS | 3 refills | Status: AC

## 2024-07-30 MED ORDER — METFORMIN HCL ER 500 MG PO TB24: 1000.0000 mg | ORAL_TABLET | Freq: Every day | ORAL | 3 refills | Status: AC

## 2024-07-30 MED ORDER — BD PEN NEEDLE NANO 2ND GEN 32G X 4 MM MISC: 1.0000 | Freq: Every day | 2 refills | Status: AC

## 2024-07-30 MED ORDER — LANTUS SOLOSTAR 100 UNIT/ML ~~LOC~~ SOPN: 15.0000 [IU] | PEN_INJECTOR | Freq: Every day | SUBCUTANEOUS | 2 refills | Status: AC

## 2024-07-30 MED ORDER — OLMESARTAN MEDOXOMIL-HCTZ 40-25 MG PO TABS: 1.0000 | ORAL_TABLET | Freq: Every day | ORAL | 2 refills | Status: AC

## 2024-07-30 NOTE — Progress Notes (Signed)
 Virtual Visit via Video Note  I connected with Kaylee Lynn  on 07/30/24 at 12:50 PM EST by a video enabled telemedicine application and verified that I am speaking with the correct person using two identifiers.   I discussed the limitations of evaluation and management by telemedicine and the availability of in person appointments. The patient expressed understanding and agreed to proceed.   -Location of the patient : Work  -Location of the provider : Office -The names of all persons participating in the telemedicine service : Pt and myself        Name: Kaylee Lynn  MRN/ DOB: [id number], 02-03-1961   Age/ Sex: 64 y.o., female    PCP: Oley Bascom RAMAN, NP   Reason for Endocrinology Evaluation: Type 2 Diabetes Mellitus     Date of Initial Endocrinology Visit: 09/18/2023    PATIENT IDENTIFIER: Kaylee Lynn is a 64 y.o. female with a past medical history of Dm, HTN. The patient presented for initial endocrinology clinic visit on 09/18/2023 for consultative assistance with her diabetes management.    HPI: Kaylee Lynn was    Diagnosed with DM 2008 Prior Medications tried/Intolerance: She was on insulin  2015-2021. Metformin  2014-2015 Hemoglobin A1c has ranged from 6.4% in 2021, peaking at > 15.0% in 2020.  She was seen by Dr. Kassie from 2014 to  2022.  She was without glycemic agents for a while prior to restarting in 05/2023  On her initial visit with me she had an A1c of  >15%, she was on metformin  and Trulicity .  I increased metformin , started her on Mounjaro  and basal insulin    Pt met with our RD in 04/2024  HTN: Her blood pressure was out of control on her initial visit with me 08/2023.  I switched olmesartan  to olmesartan /HCTZ  She didn't take Gabapentin  due to skepticisim about side effects    SUBJECTIVE:   During the last visit (03/19/2024): A1c 8.0%  Today (07/30/24): Kaylee Lynn is here for follow-up on diabetes management.  She checks  blood sugars occasionally . The patient has not had hypoglycemic episodes .    Since her last visit here, she has been able to meet with our RD, she had made dietary changes and increased walking She has lost 8 LB's since increasing the Mounjaro  dose which was approximately 2 months ago She has no nausea or vomiting No constipation or diarrhea   HOME DIABETES REGIMEN: Metformin  500 mg XR, 1 tablet twice daily-has been taking once daily  Mounjaro  10 mg weekly Lantus  15 units daily Olmesartan -HCTZ 40-25 mg daily    Statin: no ACE-I/ARB: yes   METER DOWNLOAD SUMMARY:   Memory recall 140s   DIABETIC COMPLICATIONS: Microvascular complications:  Neuropathy Denies: CKD Last eye exam: Completed 11/01/2023  Macrovascular complications:   Denies: CAD, PVD, CVA   PAST HISTORY: Past Medical History:  Past Medical History:  Diagnosis Date   Allergy    Anemia    history   Bronchitis    Hx - uses inhaler prn   Hypertension    Type I (juvenile type) diabetes mellitus without mention of complication, not stated as uncontrolled    per patient type 2    Vertigo    Past Surgical History:  Past Surgical History:  Procedure Laterality Date   left eye surgery     at age 64yrs, weak muscle   missed abortion     elective     Social History:  reports that she has  never smoked. She has never used smokeless tobacco. She reports that she does not currently use alcohol after a past usage of about 1.0 standard drink of alcohol per week. She reports that she does not use drugs. Family History:  Family History  Problem Relation Age of Onset   Hypertension Mother    Hypertension Father    CAD Father        CABG   Hypertension Sister    Stroke Brother    Hypertension Brother    Colon cancer Neg Hx    Esophageal cancer Neg Hx    Rectal cancer Neg Hx    Stomach cancer Neg Hx      HOME MEDICATIONS: Allergies as of 07/30/2024       Reactions   Other Itching         Medication List        Accurate as of July 30, 2024  6:56 AM. If you have any questions, ask your nurse or doctor.          Accu-Chek Guide Test test strip Generic drug: glucose blood USE AS DIRECTED 3 TIMES A DAY (MORNING, NOON, AND BEDTIME)   amLODipine  10 MG tablet Commonly known as: NORVASC  Take 1 tablet (10 mg total) by mouth daily.   ascorbic acid 500 MG tablet Commonly known as: VITAMIN C Take 500 mg by mouth daily.   BD Pen Needle Nano 2nd Gen 32G X 4 MM Misc Generic drug: Insulin  Pen Needle Inject 1 each as directed as needed.   Blood Glucose Monitoring Suppl Devi 1 each by Does not apply route in the morning, at noon, and at bedtime. May substitute to any manufacturer covered by patient's insurance.   Accu-Chek Guide Me w/Device Kit 3 (three) times daily.   CENTRUM SILVER 50+WOMEN PO Take 1 tablet by mouth daily.   cetirizine  10 MG tablet Commonly known as: ZYRTEC  Take 1 tablet (10 mg total) by mouth daily.   Dexcom G7 Sensor Misc 1 Device by Does not apply route as directed.   fluticasone  50 MCG/ACT nasal spray Commonly known as: FLONASE  Place 2 sprays into both nostrils daily.   gabapentin  100 MG capsule Commonly known as: NEURONTIN  Take 1 capsule (100 mg total) by mouth at bedtime.   ipratropium 0.06 % nasal spray Commonly known as: ATROVENT  Place 2 sprays into both nostrils 4 (four) times daily.   Lantus  SoloStar 100 UNIT/ML Solostar Pen Generic drug: insulin  glargine Inject 15 Units into the skin daily.   magnesium chloride 64 MG Tbec SR tablet Commonly known as: SLOW-MAG Take 1 tablet by mouth daily.   metFORMIN  500 MG 24 hr tablet Commonly known as: GLUCOPHAGE -XR TAKE 1 TABLET BY MOUTH EVERY DAY WITH BREAKFAST   olmesartan -hydrochlorothiazide  40-25 MG tablet Commonly known as: BENICAR  HCT TAKE 1 TABLET BY MOUTH EVERY DAY   olopatadine  0.1 % ophthalmic solution Commonly known as: PATANOL Place 1 drop into both eyes 2 (two)  times daily.   OMEGA 3 PO Take 1 tablet by mouth.   OneTouch Delica Lancets 33G Misc USE AS DIRECTED TWICE A DAY   tirzepatide  10 MG/0.5ML Pen Commonly known as: MOUNJARO  Inject 10 mg into the skin once a week.   triamcinolone  cream 0.1 % Commonly known as: KENALOG  APPLY 1 APPLICATION 3 (THREE) TIMES DAILY AS NEEDED FOR ITCHING OR RASH   trimethoprim -polymyxin b  ophthalmic solution Commonly known as: POLYTRIM  Place 1 drop into the right eye every 6 (six) hours.  ALLERGIES: Allergies  Allergen Reactions   Other Itching     REVIEW OF SYSTEMS: A comprehensive ROS was conducted with the patient and is negative except as per HPI    OBJECTIVE:   VITAL SIGNS: LMP 07/16/2012     There were no vitals filed for this visit.   PHYSICAL EXAM:  General: Pt appears well and is in NAD  Neuro: MS is good with appropriate affect, pt is alert and Ox3    DM foot exam: 12/12/2023  The skin of the feet is intact without sores or ulcerations. The pedal pulses are 2+ on right and 2+ on left. The sensation is intact to a screening 5.07, 10 gram monofilament bilaterally   DATA REVIEWED:  Lab Results  Component Value Date   HGBA1C 8.0 (A) 03/19/2024   HGBA1C 10.8 (A) 12/12/2023   HGBA1C >15.0 09/18/2023     Latest Reference Range & Units 05/29/23 10:39 05/29/23 10:41  COMPREHENSIVE METABOLIC PANEL  Rpt !   Sodium 134 - 144 mmol/L 136   Potassium 3.5 - 5.2 mmol/L 4.3   Chloride 96 - 106 mmol/L 97   CO2 20 - 29 mmol/L 24   Glucose 70 - 99 mg/dL 600 (H)   BUN 8 - 27 mg/dL 16   Creatinine 9.42 - 1.00 mg/dL 9.20   Calcium 8.7 - 89.6 mg/dL 9.3   BUN/Creatinine Ratio 12 - 28  20   eGFR >59 mL/min/1.73 83   Alkaline Phosphatase 44 - 121 IU/L 122 (H)   Albumin 3.9 - 4.9 g/dL 3.8 (L)   AST 0 - 40 IU/L 23   ALT 0 - 32 IU/L 18   Total Protein 6.0 - 8.5 g/dL 7.0   Total Bilirubin 0.0 - 1.2 mg/dL <9.7   Total CHOL/HDL Ratio 0.0 - 4.4 ratio 3.5   Cholesterol, Total 100  - 199 mg/dL 762 (H)   HDL Cholesterol >39 mg/dL 68   MICROALB/CREAT RATIO 0 - 29 mg/g creat  2,070 (H)  Triglycerides 0 - 149 mg/dL 886   VLDL Cholesterol Cal 5 - 40 mg/dL 20   LDL Chol Calc (NIH) 0 - 99 mg/dL 850 (H)      ASSESSMENT / PLAN / RECOMMENDATIONS:   1) Type 2 Diabetes Mellitus, poorly controlled, With neuropathic and  Microalbuminuria  complications - . Goal A1c < 7.0 %.    -This was a virtual visit -Somehow she has been taking 1 tablets of metformin  instead of 2, she had no intolerance, I will increase it as below, she may take both together every morning -She is tolerating Mounjaro , will increase the dose as below - Encouraged to continue with healthy lifestyle changes - Patient will drop off her FMLA papers   MEDICATIONS: Change metformin  500 mg XR, 2 tablets  daily Increase Mounjaro  12.5 mg weekly Continue Lantus  15 units daily  EDUCATION / INSTRUCTIONS: BG monitoring instructions: Patient is instructed to check her blood sugars 1 times a day. Call [facility] if: BG persistently < 70  I reviewed the Rule of 15 for the treatment of hypoglycemia in detail with the patient. Literature supplied.   2) Diabetic complications:  Eye: Does not have known diabetic retinopathy.  Neuro/ Feet: Does  have known diabetic peripheral neuropathy. Renal: Patient does not have known baseline CKD. She is  on an ACEI/ARB at present.  3) Hypertension:   - Historically this has been well-controlled on the current dose - No change  Medication  Continue olmesartan -HCTZ 40-25 mg  daily  4) Microalbuminuria:  -Emphasized the importance of optimizing glucose control -She is already on ARB -We will continue to monitor, if this does not improve will refer to nephrology    Follow-up in 4 months  Signed electronically by: Stefano Redgie Butts, MD  [facility]  [facility] [address] [place] [place], [place] [address] Phone:  [phone] FAX: [phone]   CC: Oley Bascom RAMAN, NP [address] [place] [address] Phone: [phone]  Fax: [phone]    Return to Endocrinology clinic as below: Future Appointments  Date Time Provider Department Center  07/30/2024 12:50 PM Mayling Aber, Donell Redgie, MD [facility] None

## 2024-07-30 NOTE — Telephone Encounter (Signed)
 Please schedule the patient for a follow-up with me in 4 months   Thanks

## 2024-07-31 ENCOUNTER — Encounter: Admitting: Dietician

## 2024-11-27 ENCOUNTER — Ambulatory Visit: Admitting: Internal Medicine
